# Patient Record
Sex: Male | Born: 1995
Health system: Southern US, Community
[De-identification: ages and names within clinical notes are randomized; demographics above are authoritative.]

## PROBLEM LIST (undated history)

## (undated) DIAGNOSIS — Z8614 Personal history of Methicillin resistant Staphylococcus aureus infection: Secondary | ICD-10-CM

## (undated) DIAGNOSIS — S83519A Sprain of anterior cruciate ligament of unspecified knee, initial encounter: Secondary | ICD-10-CM

## (undated) HISTORY — PX: ORCHIOPEXY: SHX479

---
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
## 2016-04-18 MED FILL — IBUPROFEN 800 MG TABLET: 800 | 7 days supply | Qty: 21 | Fill #0

## 2016-04-18 MED FILL — diazePAM 5 MG TABS: 5 | 1 days supply | Qty: 1 | Fill #0

## 2016-04-18 MED FILL — AMOXICILLIN 500 MG CAPSULE: 500 | 7 days supply | Qty: 21 | Fill #0

## 2016-04-18 MED FILL — METHYLPREDNISOLONE 4 MG TAB: 4 | 6 days supply | Qty: 21 | Fill #0

## 2016-04-18 MED FILL — OXYCODONE/APAP 5/325 MG TAB: 5-325 | 6 days supply | Qty: 24 | Fill #0

## 2016-05-03 MED FILL — AMOXICILLIN 500 MG CAPSULE: 500 | 7 days supply | Qty: 21 | Fill #0

## 2016-07-23 ENCOUNTER — Telehealth: Payer: Self-pay | Admitting: Internal Medicine

## 2016-07-23 NOTE — Telephone Encounter (Signed)
Patients mother Joshua Carpenter is a current patient of yours. 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 Please advise.

## 2016-07-30 NOTE — Telephone Encounter (Signed)
Yes I will see him

## 2016-08-23 ENCOUNTER — Encounter: Payer: Self-pay | Admitting: Internal Medicine

## 2016-08-23 ENCOUNTER — Ambulatory Visit (INDEPENDENT_AMBULATORY_CARE_PROVIDER_SITE_OTHER): Payer: PRIVATE HEALTH INSURANCE | Admitting: Internal Medicine

## 2016-08-23 ENCOUNTER — Other Ambulatory Visit (INDEPENDENT_AMBULATORY_CARE_PROVIDER_SITE_OTHER): Payer: PRIVATE HEALTH INSURANCE

## 2016-08-23 VITALS — BP 112/80 | HR 72 | Temp 98.3°F | Resp 16 | Ht 67.4 in | Wt 208.0 lb

## 2016-08-23 DIAGNOSIS — Z23 Encounter for immunization: Secondary | ICD-10-CM | POA: Diagnosis not present

## 2016-08-23 DIAGNOSIS — Z0000 Encounter for general adult medical examination without abnormal findings: Secondary | ICD-10-CM

## 2016-08-23 DIAGNOSIS — J4530 Mild persistent asthma, uncomplicated: Secondary | ICD-10-CM | POA: Insufficient documentation

## 2016-08-23 LAB — COMPREHENSIVE METABOLIC PANEL
ALBUMIN: 4.5 g/dL (ref 3.5–5.2)
ALK PHOS: 73 U/L (ref 39–117)
ALT: 15 U/L (ref 0–53)
AST: 18 U/L (ref 0–37)
BUN: 9 mg/dL (ref 6–23)
CO2: 31 mEq/L (ref 19–32)
CREATININE: 1.07 mg/dL (ref 0.40–1.50)
Calcium: 9.9 mg/dL (ref 8.4–10.5)
Chloride: 107 mEq/L (ref 96–112)
GFR: 112.29 mL/min (ref >60.00)
Glucose, Bld: 84 mg/dL (ref 70–99)
Potassium: 5 mEq/L (ref 3.5–5.1)
SODIUM: 143 meq/L (ref 135–145)
TOTAL PROTEIN: 7.4 g/dL (ref 6.0–8.3)
Total Bilirubin: 0.5 mg/dL (ref 0.2–1.2)

## 2016-08-23 LAB — TSH: TSH: 1.3 u[IU]/mL (ref 0.35–5.50)

## 2016-08-23 LAB — CBC WITH DIFFERENTIAL/PLATELET
BASOS ABS: 0 10*3/uL (ref 0.0–0.1)
Basophils Relative: 0.7 % (ref 0.0–3.0)
EOS ABS: 0.4 10*3/uL (ref 0.0–0.7)
Eosinophils Relative: 7.9 % — ABNORMAL HIGH (ref 0.0–5.0)
HCT: 47.7 % (ref 39.0–52.0)
HEMOGLOBIN: 16.1 g/dL (ref 13.0–17.0)
Lymphocytes Relative: 45 % (ref 12.0–46.0)
Lymphs Abs: 2.1 10*3/uL (ref 0.7–4.0)
MCHC: 33.8 g/dL (ref 30.0–36.0)
MCV: 89.1 fl (ref 78.0–100.0)
MONO ABS: 0.5 10*3/uL (ref 0.1–1.0)
Monocytes Relative: 9.9 % (ref 3.0–12.0)
Neutro Abs: 1.7 10*3/uL (ref 1.4–7.7)
Neutrophils Relative %: 36.5 % — ABNORMAL LOW (ref 43.0–77.0)
Platelets: 222 10*3/uL (ref 150.0–400.0)
RBC: 5.35 Mil/uL (ref 4.22–5.81)
RDW: 13.6 % (ref 11.5–14.6)
WBC: 4.6 10*3/uL (ref 4.5–10.5)

## 2016-08-23 LAB — LIPID PANEL
CHOLESTEROL: 180 mg/dL (ref 0–200)
HDL: 37.4 mg/dL — ABNORMAL LOW (ref >39.00)
LDL Cholesterol: 130 mg/dL — ABNORMAL HIGH (ref 0–99)
NonHDL: 142.69
TRIGLYCERIDES: 62 mg/dL (ref 0.0–149.0)
Total CHOL/HDL Ratio: 5
VLDL: 12.4 mg/dL (ref 0.0–40.0)

## 2016-08-23 NOTE — Patient Instructions (Signed)

## 2016-08-23 NOTE — Progress Notes (Signed)
Subjective:  Patient ID: Joshua Carpenter, male    DOB: 1996/08/15  Age: 20 y.o. MRN: [id number]  CC: Annual Exam and Asthma  NEW TO ME  HPI Joshua Carpenter presents for a CPX. He feels well and offers no complaints. He has a remote history of asthma but has had no recent exacerbations and has not felt the need to use his albuterol inhaler lately.  Outpatient Medications Prior to Visit  Medication Sig Dispense Refill  . albuterol (PROVENTIL HFA;VENTOLIN HFA) 108 (90 BASE) MCG/ACT inhaler Inhale 2 puffs into the lungs every 6 (six) hours as needed. For shortness of breath     . Multiple Vitamin (MULTIVITAMIN) tablet Take 1 tablet by mouth daily.    Marland Kitchen. albuterol (PROVENTIL HFA;VENTOLIN HFA) 108 (90 BASE) MCG/ACT inhaler Inhale 2 puffs into the lungs every 4 (four) hours as needed for wheezing. 1 Inhaler 0  . HYDROcodone-acetaminophen (NORCO) 10-325 MG per tablet Take 1-2 tablets by mouth every 6 (six) hours as needed for pain. 50 tablet 0   No facility-administered medications prior to visit.     ROS Review of Systems  Constitutional: Negative.  Negative for chills and fatigue.  HENT: Negative.   Eyes: Negative.  Negative for visual disturbance.  Respiratory: Negative for cough, choking, shortness of breath and stridor.   Cardiovascular: Negative for chest pain, palpitations and leg swelling.  Gastrointestinal: Negative.  Negative for abdominal pain.  Endocrine: Negative.   Genitourinary: Negative.  Negative for discharge, dysuria, frequency, penile pain, penile swelling, scrotal swelling and urgency.  Musculoskeletal: Negative.   Allergic/Immunologic: Negative.   Neurological: Negative.  Negative for dizziness.  Hematological: Negative.  Negative for adenopathy. Does not bruise/bleed easily.  Psychiatric/Behavioral: Negative.     Objective:  BP 112/80 (BP Location: Left Arm, Patient Position: Sitting, Cuff Size: Large)   Pulse 72   Temp 98.3 F (36.8 C) (Oral)   Resp 16    Ht 5' 7.4" (1.712 m)   Wt 208 lb (94.3 kg)   SpO2 98%   BMI 32.19 kg/m   BP Readings from Last 3 Encounters:  08/23/16 112/80  10/23/12 117/72  10/14/11 117/57    Wt Readings from Last 3 Encounters:  08/23/16 208 lb (94.3 kg)  10/23/12 178 lb 12.8 oz (81.1 kg) (89 %, Z= 1.22)*  10/14/11 161 lb 13.1 oz (73.4 kg) (84 %, Z= 0.98)*   * Growth percentiles are based on CDC 2-20 Years data.    Physical Exam  Constitutional: He is oriented to person, place, and time. No distress.  HENT:  Mouth/Throat: Oropharynx is clear and moist. No oropharyngeal exudate.  Eyes: Conjunctivae are normal. Right eye exhibits no discharge. Left eye exhibits no discharge. No scleral icterus.  Neck: Normal range of motion. Neck supple. No JVD present. No tracheal deviation present. No thyromegaly present.  Cardiovascular: Normal rate, regular rhythm, normal heart sounds and intact distal pulses.  Exam reveals no gallop and no friction rub.   No murmur heard. Pulmonary/Chest: Effort normal and breath sounds normal. No stridor. No respiratory distress. He has no wheezes. He has no rales. He exhibits no tenderness.  Abdominal: Soft. Bowel sounds are normal. He exhibits no distension and no mass. There is no tenderness. There is no rebound and no guarding. Hernia confirmed negative in the right inguinal area and confirmed negative in the left inguinal area.  Genitourinary: Testes normal and penis normal. Right testis shows no mass, no swelling and no tenderness. Right testis is descended. Left  testis shows no mass, no swelling and no tenderness. Left testis is descended. Circumcised. No penile erythema or penile tenderness. No discharge found.  Musculoskeletal: Normal range of motion. He exhibits no edema, tenderness or deformity.  Lymphadenopathy:    He has no cervical adenopathy.       Right: No inguinal adenopathy present.       Left: No inguinal adenopathy present.  Neurological: He is oriented to person,  place, and time.  Skin: Skin is warm and dry. No rash noted. He is not diaphoretic. No erythema. No pallor.  Vitals reviewed.   Lab Results  Component Value Date   HGB 15.4 10/23/2012    No results found.  Assessment & Plan:   Joshua Carpenter was seen today for annual exam and asthma.  Diagnoses and all orders for this visit:  Mild persistent asthma without complication  Routine general medical examination at a health care facility- exam completed, labs ordered and reviewed, vaccines reviewed and updated, patient education material was given. -     Lipid panel; Future -     Comprehensive metabolic panel; Future -     CBC with Differential/Platelet; Future -     TSH; Future   I have discontinued Mr. Pickering HYDROcodone-acetaminophen. I am also having him maintain his albuterol and multivitamin.  No orders of the defined types were placed in this encounter.    Follow-up: No Follow-up on file.  Sanda Linger, MD

## 2016-08-23 NOTE — Progress Notes (Signed)
Pre visit review using our clinic review tool, if applicable. No additional management support is needed unless otherwise documented below in the visit note. 

## 2017-06-14 MED FILL — CEPHALEXIN 500 MG CAPSULE: 500 | 10 days supply | Qty: 40 | Fill #0

## 2017-08-14 DIAGNOSIS — M25561 Pain in right knee: Secondary | ICD-10-CM | POA: Diagnosis not present

## 2018-05-07 ENCOUNTER — Encounter: Payer: Self-pay | Admitting: Internal Medicine

## 2018-05-07 ENCOUNTER — Other Ambulatory Visit (INDEPENDENT_AMBULATORY_CARE_PROVIDER_SITE_OTHER): Payer: 59

## 2018-05-07 ENCOUNTER — Ambulatory Visit (INDEPENDENT_AMBULATORY_CARE_PROVIDER_SITE_OTHER): Payer: 59 | Admitting: Internal Medicine

## 2018-05-07 VITALS — BP 108/64 | HR 58 | Temp 98.2°F | Resp 16 | Ht 67.4 in | Wt 197.0 lb

## 2018-05-07 DIAGNOSIS — Z0000 Encounter for general adult medical examination without abnormal findings: Secondary | ICD-10-CM | POA: Diagnosis not present

## 2018-05-07 DIAGNOSIS — Z7251 High risk heterosexual behavior: Secondary | ICD-10-CM | POA: Diagnosis not present

## 2018-05-07 DIAGNOSIS — J4530 Mild persistent asthma, uncomplicated: Secondary | ICD-10-CM

## 2018-05-07 LAB — URINALYSIS, ROUTINE W REFLEX MICROSCOPIC
Hgb urine dipstick: NEGATIVE
KETONES UR: NEGATIVE
LEUKOCYTES UA: NEGATIVE
Nitrite: NEGATIVE
RBC / HPF: NONE SEEN (ref 0–?)
TOTAL PROTEIN, URINE-UPE24: NEGATIVE
URINE GLUCOSE: NEGATIVE
Urobilinogen, UA: 0.2 (ref 0.0–1.0)
pH: 6 (ref 5.0–8.0)

## 2018-05-07 MED ORDER — ALBUTEROL SULFATE HFA 108 (90 BASE) MCG/ACT IN AERS: 2.0000 | INHALATION_SPRAY | Freq: Four times a day (QID) | RESPIRATORY_TRACT | 1 refills | Status: DC | PRN

## 2018-05-07 NOTE — Progress Notes (Signed)
Subjective:  Patient ID: Joshua CivilDarius J Carpenter, male    DOB: 08-10-1996  Age: 22 y.o. MRN: [id number]  CC: Annual Exam   HPI Joshua Carpenter presents for a CPX.  He wants to be screened for sexually transmitted infections.  He has no suspicious symptoms and has never had an STI before.  He has had 3 male partners in the past and engages in unprotected intercourse.  Outpatient Medications Prior to Visit  Medication Sig Dispense Refill  . Multiple Vitamin (MULTIVITAMIN) tablet Take 1 tablet by mouth daily.    Marland Kitchen. albuterol (PROVENTIL HFA;VENTOLIN HFA) 108 (90 BASE) MCG/ACT inhaler Inhale 2 puffs into the lungs every 6 (six) hours as needed. For shortness of breath      No facility-administered medications prior to visit.     ROS Review of Systems  Constitutional: Negative.  Negative for chills, fatigue and fever.  HENT: Negative.  Negative for sore throat and trouble swallowing.   Eyes: Negative for pain and visual disturbance.  Respiratory: Negative.  Negative for apnea.   Cardiovascular: Negative for chest pain.  Gastrointestinal: Negative for abdominal pain, constipation, diarrhea, nausea and vomiting.  Endocrine: Negative.   Genitourinary: Negative for difficulty urinating, discharge, dysuria, genital sores, penile swelling, scrotal swelling, testicular pain and urgency.  Musculoskeletal: Negative.   Skin: Negative.  Negative for rash.  Allergic/Immunologic: Negative.   Neurological: Negative.  Negative for dizziness.  Hematological: Negative for adenopathy. Does not bruise/bleed easily.  Psychiatric/Behavioral: Negative.     Objective:  BP 108/64 (BP Location: Left Arm, Patient Position: Sitting, Cuff Size: Normal)   Pulse (!) 58   Temp 98.2 F (36.8 C) (Oral)   Resp 16   Ht 5' 7.4" (1.712 m)   Wt 197 lb (89.4 kg)   SpO2 96%   BMI 30.49 kg/m   BP Readings from Last 3 Encounters:  05/07/18 108/64  08/23/16 112/80  10/23/12 117/72 (48 %, Z = -0.05 /  62 %, Z = 0.31)*    *BP percentiles are based on the August 2017 AAP Clinical Practice Guideline for boys    Wt Readings from Last 3 Encounters:  05/07/18 197 lb (89.4 kg)  08/23/16 208 lb (94.3 kg)  10/23/12 178 lb 12.8 oz (81.1 kg) (89 %, Z= 1.22)*   * Growth percentiles are based on CDC (Boys, 2-20 Years) data.    Physical Exam  Constitutional: He is oriented to person, place, and time. No distress.  HENT:  Mouth/Throat: Oropharynx is clear and moist. No oropharyngeal exudate.  Eyes: Conjunctivae are normal. No scleral icterus.  Neck: Normal range of motion. Neck supple. No JVD present. No thyromegaly present.  Cardiovascular: Normal rate and normal heart sounds.  No murmur heard. Pulmonary/Chest: Effort normal and breath sounds normal. He has no wheezes.  Abdominal: Soft. Bowel sounds are normal. He exhibits no mass. There is no hepatosplenomegaly. There is no tenderness. No hernia. Hernia confirmed negative in the right inguinal area and confirmed negative in the left inguinal area.  Genitourinary: Testes normal and penis normal. Right testis shows no mass, no swelling and no tenderness. Left testis shows no mass, no swelling and no tenderness. Circumcised. No penile erythema or penile tenderness. No discharge found.  Musculoskeletal: Normal range of motion. He exhibits no edema, tenderness or deformity.  Lymphadenopathy:    He has no cervical adenopathy. No inguinal adenopathy noted on the right or left side.  Neurological: He is alert and oriented to person, place, and time.  Skin: Skin  is warm and dry. No rash noted. He is not diaphoretic.  Vitals reviewed.   Lab Results  Component Value Date   WBC 4.6 08/23/2016   HGB 16.1 08/23/2016   HCT 47.7 08/23/2016   PLT 222.0 08/23/2016   GLUCOSE 84 08/23/2016   CHOL 180 08/23/2016   TRIG 62.0 08/23/2016   HDL 37.40 (L) 08/23/2016   LDLCALC 130 (H) 08/23/2016   ALT 15 08/23/2016   AST 18 08/23/2016   NA 143 08/23/2016   K 5.0 08/23/2016     CL 107 08/23/2016   CREATININE 1.07 08/23/2016   BUN 9 08/23/2016   CO2 31 08/23/2016   TSH 1.30 08/23/2016    No results found.  Assessment & Plan:   Joshua Carpenter was seen today for annual exam.  Diagnoses and all orders for this visit:  Routine general medical examination at a health care facility- Exam completed, labs reviewed, vaccines reviewed, patient education material was given. -     HIV antibody; Future -     RPR; Future -     Urinalysis, Routine w reflex microscopic; Future -     Cancel: GC/Chlamydia Probe Amp; Future -     GC/Chlamydia Probe Amp; Future  Mild persistent asthma without complication  High risk heterosexual behavior- Screening for STIs is negative.  He was advised to practice safe sexual activities. -     HIV antibody; Future -     RPR; Future -     Urinalysis, Routine w reflex microscopic; Future -     Cancel: GC/Chlamydia Probe Amp; Future -     GC/Chlamydia Probe Amp; Future  Other orders -     albuterol (PROVENTIL HFA;VENTOLIN HFA) 108 (90 Base) MCG/ACT inhaler; Inhale 2 puffs into the lungs every 6 (six) hours as needed. For shortness of breath   I am having Joshua Carpenter maintain his multivitamin and albuterol.  Meds ordered this encounter  Medications  . albuterol (PROVENTIL HFA;VENTOLIN HFA) 108 (90 Base) MCG/ACT inhaler    Sig: Inhale 2 puffs into the lungs every 6 (six) hours as needed. For shortness of breath    Dispense:  3 Inhaler    Refill:  1     Follow-up: Return if symptoms worsen or fail to improve.  Sanda Linger, MD

## 2018-05-07 NOTE — Patient Instructions (Signed)

## 2018-05-08 LAB — GC/CHLAMYDIA PROBE AMP
CHLAMYDIA, DNA PROBE: NEGATIVE
Neisseria gonorrhoeae by PCR: NEGATIVE

## 2018-05-09 ENCOUNTER — Encounter: Payer: Self-pay | Admitting: Internal Medicine

## 2018-05-09 LAB — HIV ANTIBODY (ROUTINE TESTING W REFLEX): HIV: NONREACTIVE

## 2018-05-09 LAB — RPR: RPR Ser Ql: NONREACTIVE

## 2018-07-14 DIAGNOSIS — S83512A Sprain of anterior cruciate ligament of left knee, initial encounter: Secondary | ICD-10-CM | POA: Diagnosis not present

## 2018-07-16 DIAGNOSIS — M25562 Pain in left knee: Secondary | ICD-10-CM | POA: Diagnosis not present

## 2018-07-21 DIAGNOSIS — M25462 Effusion, left knee: Secondary | ICD-10-CM | POA: Diagnosis not present

## 2018-07-30 ENCOUNTER — Other Ambulatory Visit: Payer: Self-pay | Admitting: Orthopedic Surgery

## 2018-08-05 DIAGNOSIS — S83519A Sprain of anterior cruciate ligament of unspecified knee, initial encounter: Secondary | ICD-10-CM

## 2018-08-05 HISTORY — DX: Sprain of anterior cruciate ligament of unspecified knee, initial encounter: S83.519A

## 2018-08-06 NOTE — Pre-Procedure Instructions (Signed)
ASAD KEEVEN  08/06/2018      [id number] [place], [place] - [address] [place] [address] Phone: [phone] Fax: [phone]    Your procedure is scheduled on Monday, Oct. 7th   Report to [facility] Admitting at  12:30pm             (posted surgery time 2:30p - 4:30p)   Call this number if you have problems the morning of surgery:  [phone]   Remember:   Do not eat any foods or drink any liquids after midnight, Sunday.              5 days prior to surgery, STOP TAKING any Vitamins, Herbal Supplements, Anti-inflammatories.    Take these medicines the morning of surgery with A SIP OF WATER : Zyrtec              May use your inhaler that morning.    Do not wear jewelry - no rings, watches, piercings.  Do not wear lotions, powders, perfumes, or deodorant.             Men may shave face and neck.  Do not bring valuables to the hospital.  [facility] is not responsible for any belongings or valuables.  Contacts, dentures or bridgework may not be worn into surgery.  Leave your suitcase in the car.  After surgery it may be brought to your room.  For patients admitted to the hospital, discharge time will be determined by your treatment team.   Please read over the following fact sheets that you were given. Pain Booklet and Surgical Site Infection Prevention       [place]- Preparing For Surgery  Before surgery, you can play an important role. Because skin is not sterile, your skin needs to be as free of germs as possible. You can reduce the number of germs on your skin by washing with CHG (chlorahexidine gluconate) Soap before surgery.  CHG is an antiseptic cleaner which kills germs and bonds with the skin to continue killing germs even after washing.    Oral Hygiene is also important to reduce your risk of infection.    Remember - BRUSH YOUR TEETH THE MORNING OF SURGERY WITH YOUR  REGULAR TOOTHPASTE  Please do not use if you have an allergy to CHG or antibacterial soaps. If your skin becomes reddened/irritated stop using the CHG.  Do not shave (including legs and underarms) for at least 48 hours prior to first CHG shower. It is OK to shave your face.  Please follow these instructions carefully.   1. Shower the NIGHT BEFORE SURGERY and the MORNING OF SURGERY with CHG.   2. If you chose to wash your hair, wash your hair first as usual with your normal shampoo.  3. After you shampoo, rinse your hair and body thoroughly to remove the shampoo.  4. Use CHG as you would any other liquid soap. You can apply CHG directly to the skin and wash gently with a scrungie or a clean washcloth.   5. Apply the CHG Soap to your body ONLY FROM THE NECK DOWN.  Do not use on open wounds or open sores. Avoid contact with your eyes, ears, mouth and genitals (private parts). Wash Face and genitals (private parts)  with your normal soap.  6. Wash thoroughly, paying special attention to the area where your surgery will be performed.  7.  Thoroughly rinse your body with warm water from the neck down.  8. DO NOT shower/wash with your normal soap after using and rinsing off the CHG Soap.  9. Pat yourself dry with a CLEAN TOWEL.  10. Wear CLEAN PAJAMAS to bed the night before surgery, wear comfortable clothes the morning of surgery  11. Place CLEAN SHEETS on your bed the night of your first shower and DO NOT SLEEP WITH PETS.  Day of Surgery:  Do not apply any deodorants/lotions.  Please wear clean clothes to the hospital/surgery center.   Remember to brush your teeth WITH YOUR REGULAR TOOTHPASTE.

## 2018-08-07 ENCOUNTER — Encounter (HOSPITAL_COMMUNITY): Payer: Self-pay

## 2018-08-07 ENCOUNTER — Encounter (HOSPITAL_COMMUNITY)
Admission: RE | Admit: 2018-08-07 | Discharge: 2018-08-07 | Disposition: A | Discharge status: Home / Self Care | Payer: 59 | Source: Ambulatory Visit | Attending: Orthopedic Surgery | Admitting: Orthopedic Surgery

## 2018-08-07 ENCOUNTER — Other Ambulatory Visit: Payer: Self-pay

## 2018-08-07 DIAGNOSIS — Z01812 Encounter for preprocedural laboratory examination: Secondary | ICD-10-CM | POA: Diagnosis not present

## 2018-08-07 LAB — CBC
HEMATOCRIT: 50.6 % (ref 39.0–52.0)
Hemoglobin: 16.1 g/dL (ref 13.0–17.0)
MCH: 29.2 pg (ref 26.0–34.0)
MCHC: 31.8 g/dL (ref 30.0–36.0)
MCV: 91.8 fL (ref 78.0–100.0)
Platelets: 231 10*3/uL (ref 150–400)
RBC: 5.51 MIL/uL (ref 4.22–5.81)
RDW: 11.9 % (ref 11.5–15.5)
WBC: 4 10*3/uL (ref 4.0–10.5)

## 2018-08-07 LAB — SURGICAL PCR SCREEN
MRSA, PCR: NEGATIVE
STAPHYLOCOCCUS AUREUS: NEGATIVE

## 2018-08-07 NOTE — Progress Notes (Signed)
PCP is Dr. Minda Ditto by Clearence Ped.  LOV 05/2018 Denies murmur, cp, sob, cardiac testing.

## 2018-08-08 ENCOUNTER — Other Ambulatory Visit: Payer: Self-pay

## 2018-08-08 ENCOUNTER — Encounter (HOSPITAL_BASED_OUTPATIENT_CLINIC_OR_DEPARTMENT_OTHER): Payer: Self-pay | Admitting: *Deleted

## 2018-08-11 ENCOUNTER — Encounter (HOSPITAL_BASED_OUTPATIENT_CLINIC_OR_DEPARTMENT_OTHER): Payer: Self-pay

## 2018-08-11 ENCOUNTER — Other Ambulatory Visit: Payer: Self-pay

## 2018-08-11 ENCOUNTER — Encounter (HOSPITAL_BASED_OUTPATIENT_CLINIC_OR_DEPARTMENT_OTHER)
Admission: RE | Disposition: A | Discharge status: Home / Self Care | Payer: Self-pay | Source: Ambulatory Visit | Attending: Orthopedic Surgery

## 2018-08-11 ENCOUNTER — Ambulatory Visit (HOSPITAL_BASED_OUTPATIENT_CLINIC_OR_DEPARTMENT_OTHER)
Admission: RE | Admit: 2018-08-11 | Discharge: 2018-08-11 | Disposition: A | Discharge status: Home / Self Care | Payer: 59 | Source: Ambulatory Visit | Attending: Orthopedic Surgery | Admitting: Orthopedic Surgery

## 2018-08-11 ENCOUNTER — Ambulatory Visit (HOSPITAL_BASED_OUTPATIENT_CLINIC_OR_DEPARTMENT_OTHER): Payer: 59 | Admitting: Anesthesiology

## 2018-08-11 DIAGNOSIS — S83282A Other tear of lateral meniscus, current injury, left knee, initial encounter: Secondary | ICD-10-CM | POA: Diagnosis not present

## 2018-08-11 DIAGNOSIS — Z8614 Personal history of Methicillin resistant Staphylococcus aureus infection: Secondary | ICD-10-CM | POA: Diagnosis not present

## 2018-08-11 DIAGNOSIS — M25562 Pain in left knee: Secondary | ICD-10-CM | POA: Diagnosis present

## 2018-08-11 DIAGNOSIS — S83512A Sprain of anterior cruciate ligament of left knee, initial encounter: Secondary | ICD-10-CM | POA: Insufficient documentation

## 2018-08-11 DIAGNOSIS — M94262 Chondromalacia, left knee: Secondary | ICD-10-CM | POA: Insufficient documentation

## 2018-08-11 DIAGNOSIS — X58XXXA Exposure to other specified factors, initial encounter: Secondary | ICD-10-CM | POA: Insufficient documentation

## 2018-08-11 DIAGNOSIS — M2242 Chondromalacia patellae, left knee: Secondary | ICD-10-CM | POA: Diagnosis not present

## 2018-08-11 DIAGNOSIS — J45909 Unspecified asthma, uncomplicated: Secondary | ICD-10-CM | POA: Insufficient documentation

## 2018-08-11 DIAGNOSIS — G8918 Other acute postprocedural pain: Secondary | ICD-10-CM | POA: Diagnosis not present

## 2018-08-11 HISTORY — DX: Sprain of anterior cruciate ligament of unspecified knee, initial encounter: S83.519A

## 2018-08-11 HISTORY — PX: KNEE ARTHROSCOPY WITH ANTERIOR CRUCIATE LIGAMENT (ACL) REPAIR: SHX5644

## 2018-08-11 SURGERY — KNEE ARTHROSCOPY WITH ANTERIOR CRUCIATE LIGAMENT (ACL) REPAIR
Anesthesia: General | Site: Knee | Laterality: Left

## 2018-08-11 MED ORDER — KETOROLAC TROMETHAMINE 30 MG/ML IJ SOLN
INTRAMUSCULAR | Status: DC | PRN
  Administered 2018-08-11: 30 mg via INTRAVENOUS

## 2018-08-11 MED ORDER — CHLORHEXIDINE GLUCONATE 4 % EX LIQD: 60.0000 mL | Freq: Once | CUTANEOUS | Status: DC

## 2018-08-11 MED ORDER — FENTANYL CITRATE (PF) 100 MCG/2ML IJ SOLN
INTRAMUSCULAR | Status: AC
  Filled 2018-08-11: qty 2

## 2018-08-11 MED ORDER — CEFAZOLIN SODIUM-DEXTROSE 2-4 GM/100ML-% IV SOLN
2.0000 g | INTRAVENOUS | Status: AC
  Administered 2018-08-11: 2 g via INTRAVENOUS

## 2018-08-11 MED ORDER — FENTANYL CITRATE (PF) 100 MCG/2ML IJ SOLN
50.0000 ug | INTRAMUSCULAR | Status: AC | PRN
  Administered 2018-08-11 (×3): 25 ug via INTRAVENOUS
  Administered 2018-08-11 (×2): 50 ug via INTRAVENOUS
  Administered 2018-08-11: 25 ug via INTRAVENOUS

## 2018-08-11 MED ORDER — DEXAMETHASONE SODIUM PHOSPHATE 10 MG/ML IJ SOLN
INTRAMUSCULAR | Status: AC
  Filled 2018-08-11: qty 1

## 2018-08-11 MED ORDER — FENTANYL CITRATE (PF) 100 MCG/2ML IJ SOLN
25.0000 ug | INTRAMUSCULAR | Status: DC | PRN
  Administered 2018-08-11: 50 ug via INTRAVENOUS

## 2018-08-11 MED ORDER — PROPOFOL 10 MG/ML IV BOLUS
INTRAVENOUS | Status: DC | PRN
  Administered 2018-08-11: 300 mg via INTRAVENOUS

## 2018-08-11 MED ORDER — ONDANSETRON HCL 4 MG/2ML IJ SOLN
INTRAMUSCULAR | Status: AC
  Filled 2018-08-11: qty 2

## 2018-08-11 MED ORDER — ROPIVACAINE HCL 5 MG/ML IJ SOLN
INTRAMUSCULAR | Status: DC | PRN
  Administered 2018-08-11: 20 mL via PERINEURAL

## 2018-08-11 MED ORDER — DIPHENHYDRAMINE HCL 50 MG/ML IJ SOLN: 12.5000 mg | Freq: Four times a day (QID) | INTRAMUSCULAR | Status: DC | PRN

## 2018-08-11 MED ORDER — OXYCODONE-ACETAMINOPHEN 5-325 MG PO TABS: 1.0000 | ORAL_TABLET | Freq: Four times a day (QID) | ORAL | 0 refills | Status: DC | PRN

## 2018-08-11 MED ORDER — SCOPOLAMINE 1 MG/3DAYS TD PT72: 1.0000 | MEDICATED_PATCH | Freq: Once | TRANSDERMAL | Status: DC | PRN

## 2018-08-11 MED ORDER — OXYCODONE HCL 5 MG/5ML PO SOLN: 5.0000 mg | Freq: Once | ORAL | Status: DC | PRN

## 2018-08-11 MED ORDER — LACTATED RINGERS IV SOLN
INTRAVENOUS | Status: DC
  Administered 2018-08-11 (×2): via INTRAVENOUS

## 2018-08-11 MED ORDER — MIDAZOLAM HCL 2 MG/2ML IJ SOLN
INTRAMUSCULAR | Status: AC
  Filled 2018-08-11: qty 2

## 2018-08-11 MED ORDER — ONDANSETRON HCL 4 MG/2ML IJ SOLN: 4.0000 mg | Freq: Once | INTRAMUSCULAR | Status: DC | PRN

## 2018-08-11 MED ORDER — LIDOCAINE 2% (20 MG/ML) 5 ML SYRINGE
INTRAMUSCULAR | Status: AC
  Filled 2018-08-11: qty 5

## 2018-08-11 MED ORDER — DEXAMETHASONE SODIUM PHOSPHATE 4 MG/ML IJ SOLN
INTRAMUSCULAR | Status: DC | PRN
  Administered 2018-08-11: 10 mg via INTRAVENOUS

## 2018-08-11 MED ORDER — DIPHENHYDRAMINE HCL 50 MG/ML IJ SOLN
12.5000 mg | Freq: Four times a day (QID) | INTRAMUSCULAR | Status: DC | PRN
  Administered 2018-08-11: 12.5 mg via INTRAVENOUS

## 2018-08-11 MED ORDER — ONDANSETRON HCL 4 MG/2ML IJ SOLN
INTRAMUSCULAR | Status: DC | PRN
  Administered 2018-08-11: 4 mg via INTRAVENOUS

## 2018-08-11 MED ORDER — LIDOCAINE HCL (CARDIAC) PF 100 MG/5ML IV SOSY
PREFILLED_SYRINGE | INTRAVENOUS | Status: DC | PRN
  Administered 2018-08-11: 100 mg via INTRAVENOUS

## 2018-08-11 MED ORDER — CEFAZOLIN SODIUM-DEXTROSE 2-4 GM/100ML-% IV SOLN
INTRAVENOUS | Status: AC
  Filled 2018-08-11: qty 100

## 2018-08-11 MED ORDER — MIDAZOLAM HCL 2 MG/2ML IJ SOLN
1.0000 mg | INTRAMUSCULAR | Status: DC | PRN
  Administered 2018-08-11: 2 mg via INTRAVENOUS

## 2018-08-11 MED ORDER — KETOROLAC TROMETHAMINE 30 MG/ML IJ SOLN
INTRAMUSCULAR | Status: AC
  Filled 2018-08-11: qty 1

## 2018-08-11 MED ORDER — DIPHENHYDRAMINE HCL 50 MG/ML IJ SOLN
INTRAMUSCULAR | Status: AC
  Filled 2018-08-11: qty 1

## 2018-08-11 MED ORDER — OXYCODONE HCL 5 MG PO TABS: 5.0000 mg | ORAL_TABLET | Freq: Once | ORAL | Status: DC | PRN

## 2018-08-11 SURGICAL SUPPLY — 73 items
APL SKNCLS STERI-STRIP NONHPOA (GAUZE/BANDAGES/DRESSINGS) ×1
BANDAGE ESMARK 6X9 LF (GAUZE/BANDAGES/DRESSINGS) IMPLANT
BENZOIN TINCTURE PRP APPL 2/3 (GAUZE/BANDAGES/DRESSINGS) ×3 IMPLANT
BLADE 4.2CUDA (BLADE) IMPLANT
BLADE AVERAGE 25MMX9MM (BLADE)
BLADE AVERAGE 25X9 (BLADE) ×1 IMPLANT
BLADE CUDA 5.5 (BLADE) IMPLANT
BLADE CUTTER GATOR 3.5 (BLADE) IMPLANT
BLADE GREAT WHITE 4.2 (BLADE) ×1 IMPLANT
BLADE GREAT WHITE 4.2MM (BLADE)
BLADE OSCIL/SAGITTAL W/10 ST (BLADE) ×1 IMPLANT
BLADE OSCIL/SAGITTAL W/10MM ST (BLADE) ×1
BLADE SURG 15 STRL LF DISP TIS (BLADE) ×1 IMPLANT
BLADE SURG 15 STRL SS (BLADE) ×3
BNDG CMPR 9X6 STRL LF SNTH (GAUZE/BANDAGES/DRESSINGS)
BNDG ESMARK 6X9 LF (GAUZE/BANDAGES/DRESSINGS)
BUR OVAL 4.0 (BURR) ×1 IMPLANT
CLOSURE WOUND 1/2 X4 (GAUZE/BANDAGES/DRESSINGS) ×1
COVER BACK TABLE 60X90IN (DRAPES) ×3 IMPLANT
DRAPE ARTHROSCOPY W/POUCH 90 (DRAPES) ×3 IMPLANT
DRSG EMULSION OIL 3X3 NADH (GAUZE/BANDAGES/DRESSINGS) ×3 IMPLANT
DURAPREP 26ML APPLICATOR (WOUND CARE) ×3 IMPLANT
ELECT MENISCUS 165MM 90D (ELECTRODE) IMPLANT
ELECT REM PT RETURN 9FT ADLT (ELECTROSURGICAL) ×3
ELECTRODE REM PT RTRN 9FT ADLT (ELECTROSURGICAL) IMPLANT
GAUZE SPONGE 4X4 12PLY STRL (GAUZE/BANDAGES/DRESSINGS) ×3 IMPLANT
GLOVE BIOGEL PI IND STRL 8 (GLOVE) ×2 IMPLANT
GLOVE BIOGEL PI INDICATOR 8 (GLOVE) ×4
GLOVE ECLIPSE 7.5 STRL STRAW (GLOVE) ×6 IMPLANT
GOWN STRL REUS W/ TWL LRG LVL3 (GOWN DISPOSABLE) ×1 IMPLANT
GOWN STRL REUS W/ TWL XL LVL3 (GOWN DISPOSABLE) ×1 IMPLANT
GOWN STRL REUS W/TWL LRG LVL3 (GOWN DISPOSABLE) ×3
GOWN STRL REUS W/TWL XL LVL3 (GOWN DISPOSABLE) ×9 IMPLANT
HOLDER KNEE FOAM BLUE (MISCELLANEOUS) ×3 IMPLANT
IMMOBILIZER KNEE 22 UNIV (SOFTGOODS) IMPLANT
IMMOBILIZER KNEE 24 THIGH 36 (MISCELLANEOUS) IMPLANT
IMMOBILIZER KNEE 24 UNIV (MISCELLANEOUS) ×3
IV NS IRRIG 3000ML ARTHROMATIC (IV SOLUTION) ×6 IMPLANT
KIT TRANSTIBIAL (DISPOSABLE) ×3 IMPLANT
KNEE WRAP E Z 3 GEL PACK (MISCELLANEOUS) ×3 IMPLANT
KNIFE GRAFT ACL 10MM 5952 (MISCELLANEOUS) IMPLANT
KNIFE GRAFT ACL 11MM (MISCELLANEOUS) IMPLANT
MANIFOLD NEPTUNE II (INSTRUMENTS) ×3 IMPLANT
NDL SAFETY ECLIPSE 18X1.5 (NEEDLE) ×2 IMPLANT
NEEDLE HYPO 18GX1.5 SHARP (NEEDLE) ×6
PACK ARTHROSCOPY DSU (CUSTOM PROCEDURE TRAY) ×3 IMPLANT
PACK BASIN DAY SURGERY FS (CUSTOM PROCEDURE TRAY) ×3 IMPLANT
PAD CAST 4YDX4 CTTN HI CHSV (CAST SUPPLIES) ×2 IMPLANT
PADDING CAST COTTON 4X4 STRL (CAST SUPPLIES) ×6
PASSER SUT SWANSON 36MM LOOP (INSTRUMENTS) IMPLANT
PENCIL BUTTON HOLSTER BLD 10FT (ELECTRODE) ×2 IMPLANT
SCREW INTERFERENCE 7X20MM (Screw) ×2 IMPLANT
SCREW SHEATHED INTERF 7X20 (Screw) ×2 IMPLANT
SHEET MEDIUM DRAPE 40X70 STRL (DRAPES) ×3 IMPLANT
SPONGE LAP 4X18 RFD (DISPOSABLE) ×3 IMPLANT
STRIP CLOSURE SKIN 1/2X4 (GAUZE/BANDAGES/DRESSINGS) ×2 IMPLANT
SUCTION FRAZIER HANDLE 10FR (MISCELLANEOUS) ×2
SUCTION TUBE FRAZIER 10FR DISP (MISCELLANEOUS) ×1 IMPLANT
SUT ETHILON 4 0 PS 2 18 (SUTURE) IMPLANT
SUT MNCRL AB 3-0 PS2 18 (SUTURE) ×3 IMPLANT
SUT PDS AB 1 CT  36 (SUTURE) ×4
SUT PDS AB 1 CT 36 (SUTURE) ×2 IMPLANT
SUT STEEL 5 (SUTURE) ×3 IMPLANT
SUT TICRON 1 T 12 (SUTURE) ×2 IMPLANT
SUT VIC AB 0 CT1 27 (SUTURE)
SUT VIC AB 0 CT1 27XBRD ANBCTR (SUTURE) IMPLANT
SUT VIC AB 2-0 SH 27 (SUTURE) ×3
SUT VIC AB 2-0 SH 27XBRD (SUTURE) IMPLANT
SYR 5ML LL (SYRINGE) ×3 IMPLANT
TOWEL GREEN STERILE FF (TOWEL DISPOSABLE) ×9 IMPLANT
TOWEL OR NON WOVEN STRL DISP B (DISPOSABLE) ×3 IMPLANT
TUBING ARTHRO INFLOW-ONLY STRL (TUBING) ×3 IMPLANT
WATER STERILE IRR 1000ML POUR (IV SOLUTION) ×3 IMPLANT

## 2018-08-11 NOTE — Anesthesia Postprocedure Evaluation (Signed)
Anesthesia Post Note  Patient: Joshua Carpenter  Procedure(s) Performed: LEFT KNEE ARTHROSCOPY WITH ANTERIOR CRUCIATE LIGAMENT (ACL) REPAIR (Left Knee)     Patient location during evaluation: PACU Anesthesia Type: General Level of consciousness: awake and alert Pain management: pain level controlled Vital Signs Assessment: post-procedure vital signs reviewed and stable Respiratory status: spontaneous breathing, nonlabored ventilation and respiratory function stable Cardiovascular status: blood pressure returned to baseline and stable Postop Assessment: no apparent nausea or vomiting Anesthetic complications: no    Last Vitals:  Vitals:   08/11/18 1135 08/11/18 1432  BP:  (!) 139/95  Pulse: (!) 50 88  Resp: 14   Temp:    SpO2: 100% 97%    Last Pain:  Vitals:   08/11/18 0956  TempSrc: Oral  PainSc:                  [name]

## 2018-08-11 NOTE — Transfer of Care (Signed)
Immediate Anesthesia Transfer of Care Note  Patient: Joshua Carpenter  Procedure(s) Performed: LEFT KNEE ARTHROSCOPY WITH ANTERIOR CRUCIATE LIGAMENT (ACL) REPAIR (Left Knee)  Patient Location: PACU  Anesthesia Type:GA combined with regional for post-op pain  Level of Consciousness: drowsy and patient cooperative  Airway & Oxygen Therapy: Patient Spontanous Breathing and Patient connected to face mask oxygen  Post-op Assessment: Report given to RN and Post -op Vital signs reviewed and stable  Post vital signs: Reviewed and stable  Last Vitals:  Vitals Value Taken Time  BP    Temp    Pulse 132 08/11/2018  2:32 PM  Resp 11 08/11/2018  2:32 PM  SpO2 91 % 08/11/2018  2:32 PM  Vitals shown include unvalidated device data.  Last Pain:  Vitals:   08/11/18 0956  TempSrc: Oral  PainSc:          Complications: No apparent anesthesia complications

## 2018-08-11 NOTE — Discharge Instructions (Signed)
POST-OP ACL RECONSTRUCTION 1. Take one 325mg  Aspirin daily with food x 2 weeks post op to decrease risk of blood clots.  2. Leave the steri-strips in place over your incisions when performing dressing changes and showering. Remove your dressings tomorrow to perform first dressings change. Then change dressing daily or as needed. You may shower and get incision wet on day 5 from surgery. Do not submerge incision in tub or under water.  3. Wear your knee immobilizer or hinged knee brace to sleep and at all times while up. You may put full weight on operative leg with brace on. Crutches are for comfort. Remove brace to perform attached exercises.  4. It is strongly recommended to ice and elevate your leg on a regular basis and at a minimum of 4 times a day for 30 minute sessions. You should ice after your exercise sessions and more if you are having a lot of pain or increase in swelling.  5. The thigh high Ted hose (white stockings) help to decrease swelling and prevent blood clots. It is recommended that you wear them on both legs for the first 3 days. Then you should wear on the operative extremity when upright but can remove to sleep.  6. If you had a block pre-operatively to provide post-op pain relief you may want to go ahead and begin utilizing your pain meds as your arm begins to wake up. Blocks can sometimes last up to 16-18 hours. If you are still pain-free prior to going to bed you may want to strongly consider taking a pain medication to avoid being awakened in the night with the onset of pain. A muscle relaxant is also provided for you should you experience muscle spasms. It is recommended that if you are experiencing pain that your pain medication alone is not controlling, add the muscle relaxant along with the pain medication which can give additional pain relief. The first one to two days is generally the most severe of your pain and then should gradually decrease. As your pain lessens it  is recommended that you decrease your use of the pain medications to an "as needed basis" only and to always comply with the recommended dosages of the pain medications.  7. Pain medications can produce constipation along with their use. If you experience this, the use of an over the counter stool softener or laxative daily is recommended.   8. For additional questions or concerns, please do not hesitate to call the office. If after hours there is an answering service to forward your concerns to the physician on call.   POST-OP EXERCISES  Repeat each exercise 10 times per set. Do 3 sets per session. Do 3 sessions per day.  Ankle/Foot Range of Motion  With leg relaxed, gently flex and extend ankle. Move through full range of motion.     Knee Extension Mobilization: Towel Prop  With a small towel rolled under your ankle, relax your leg to feel a comfortable stretch along the backside of your knee/leg. Hold for 3-5 minutes.     Hip/Knee Strengthening: Quadriceps Set  Tighten your quadriceps (top of thigh) by pulling your patella (kneecap) toward your hip. Keep your buttocks relaxed. Hold for 5-10 seconds.     Hip/Knee Strengthening: Straight Leg Raise  With your brace on, tighten your quadriceps and lift your leg 12-18 inches from surface.     Hip/Knee Stretching: Calf - Towel  Sit with knee straight and towel looped around your foot. Gently  pull on towel until stretch is felt in calf. Hold for 20-30 seconds.     Hip/Knee: Knee Flexion  With a towel around your heel, gently pull knee up with towel until stretch is felt. Hold 20-30 seconds.       Regional Anesthesia Blocks  1. Numbness or the inability to move the "blocked" extremity may last from 3-48 hours after placement. The length of time depends on the medication injected and your individual response to the medication. If the numbness is not going away after 48 hours, call your surgeon.  2. The extremity  that is blocked will need to be protected until the numbness is gone and the  Strength has returned. Because you cannot feel it, you will need to take extra care to avoid injury. Because it may be weak, you may have difficulty moving it or using it. You may not know what position it is in without looking at it while the block is in effect.  3. For blocks in the legs and feet, returning to weight bearing and walking needs to be done carefully. You will need to wait until the numbness is entirely gone and the strength has returned. You should be able to move your leg and foot normally before you try and bear weight or walk. You will need someone to be with you when you first try to ensure you do not fall and possibly risk injury.  4. Bruising and tenderness at the needle site are common side effects and will resolve in a few days.  5. Persistent numbness or new problems with movement should be communicated to the surgeon or the [facility] [phone] [facility] [phone]).    Post Anesthesia Home Care Instructions  Activity: Get plenty of rest for the remainder of the day. A responsible individual must stay with you for 24 hours following the procedure.  For the next 24 hours, DO NOT: -Drive a car -Advertising copywriter -Drink alcoholic beverages -Take any medication unless instructed by your physician -Make any legal decisions or sign important papers.  Meals: Start with liquid foods such as gelatin or soup. Progress to regular foods as tolerated. Avoid greasy, spicy, heavy foods. If nausea and/or vomiting occur, drink only clear liquids until the nausea and/or vomiting subsides. Call your physician if vomiting continues.  Special Instructions/Symptoms: Your throat may feel dry or sore from the anesthesia or the breathing tube placed in your throat during surgery. If this causes discomfort, gargle with warm salt water. The discomfort should disappear within 24  hours.  If you had a scopolamine patch placed behind your ear for the management of post- operative nausea and/or vomiting:  1. The medication in the patch is effective for 72 hours, after which it should be removed.  Wrap patch in a tissue and discard in the trash. Wash hands thoroughly with soap and water. 2. You may remove the patch earlier than 72 hours if you experience unpleasant side effects which may include dry mouth, dizziness or visual disturbances. 3. Avoid touching the patch. Wash your hands with soap and water after contact with the patch.   You received Toradol 30 mg IV at 2:00pm. You must wait until after 8:00pm to take any ibuprofen.  You received benadryl 12.5 mg  At 2:45pm, please wait 6 hours before taking benadryl again if needed.

## 2018-08-11 NOTE — Brief Op Note (Signed)
08/11/2018  4:56 PM  PATIENT:  Carole Civil  22 y.o. male  PRE-OPERATIVE DIAGNOSIS:  LEFT KNEE ANTERIOR CRUCIATE LIGAMENT TEAR  POST-OPERATIVE DIAGNOSIS:  LEFT KNEE ANTERIOR CRUCIATE LIGAMENT TEAR  PROCEDURE:  Procedure(s): LEFT KNEE ARTHROSCOPY WITH ANTERIOR CRUCIATE LIGAMENT (ACL) REPAIR (Left)  SURGEON:  Surgeon(s) and Role:    Jodi Geralds, MD - Primary  PHYSICIAN ASSISTANT:   ASSISTANTS: bethune   ANESTHESIA:   general  EBL:  10 mL   BLOOD ADMINISTERED:none  DRAINS: none   LOCAL MEDICATIONS USED:  MARCAINE     SPECIMEN:  No Specimen  DISPOSITION OF SPECIMEN:  N/A  COUNTS:  YES  TOURNIQUET:   Total Tourniquet Time Documented: Thigh (Left) - 60 minutes Total: Thigh (Left) - 60 minutes   DICTATION: .Other Dictation: Dictation Number [id number]  PLAN OF CARE: Discharge to home after PACU  PATIENT DISPOSITION:  PACU - hemodynamically stable.   Delay start of Pharmacological VTE agent (>24hrs) due to surgical blood loss or risk of bleeding: no

## 2018-08-11 NOTE — H&P (Signed)
A pre op hand p   Chief Complaint: Left knee pain and instability  HPI: Joshua Carpenter is a 22 y.o. male who presents for evaluation of knee pain and instability. It has been present for greater than 3 months and has been worsening. He has failed conservative measures. Pain is rated as moderate.  Past Medical History:  Diagnosis Date  . ACL tear 08/2018   left  . Asthma    prn inhaler  . History of MRSA infection 2012   left leg   Past Surgical History:  Procedure Laterality Date  . OPEN REDUCTION INTERNAL FIXATION (ORIF) METACARPAL  10/23/2012   Procedure: OPEN REDUCTION INTERNAL FIXATION (ORIF) METACARPAL;  Surgeon: Loreta Ave, MD;  Location: [facility];  Service: Orthopedics;  Laterality: Left;  Open Reduction Internal Fixation Third Metacarpal Left Hand  . ORCHIOPEXY  age 28 mos.   Social History   Socioeconomic History  . Marital status: Single    Spouse name: Not on file  . Number of children: Not on file  . Years of education: Not on file  . Highest education level: Not on file  Occupational History  . Not on file  Social Needs  . Financial resource strain: Not on file  . Food insecurity:    Worry: Not on file    Inability: Not on file  . Transportation needs:    Medical: Not on file    Non-medical: Not on file  Tobacco Use  . Smoking status: Never Smoker  . Smokeless tobacco: Never Used  Substance and Sexual Activity  . Alcohol use: No  . Drug use: No  . Sexual activity: Not on file  Lifestyle  . Physical activity:    Days per week: Not on file    Minutes per session: Not on file  . Stress: Not on file  Relationships  . Social connections:    Talks on phone: Not on file    Gets together: Not on file    Attends religious service: Not on file    Active member of club or organization: Not on file    Attends meetings of clubs or organizations: Not on file    Relationship status: Not on file  Other Topics Concern  . Not on file   Social History Narrative  . Not on file   Family History  Problem Relation Age of Onset  . Hypertension Mother   . Anesthesia problems Mother        itching  . Asthma Father   . Diabetes Maternal Grandfather   . Hypertension Maternal Grandfather   . Heart disease Maternal Grandfather    No Known Allergies Prior to Admission medications   Medication Sig Start Date End Date Taking? Authorizing Provider  ibuprofen (ADVIL,MOTRIN) 200 MG tablet Take 200 mg by mouth every 6 (six) hours as needed.   Yes [provider]  Multiple Vitamin (MULTIVITAMIN) tablet Take 1 tablet by mouth daily.   Yes [provider]  albuterol (PROVENTIL HFA;VENTOLIN HFA) 108 (90 Base) MCG/ACT inhaler Inhale into the lungs every 6 (six) hours as needed for wheezing or shortness of breath.    [provider]     Positive ROS: None  All other systems have been reviewed and were otherwise negative with the exception of those mentioned in the HPI and as above.  Physical Exam: Vitals:   08/11/18 1130 08/11/18 1135  BP:    Pulse: (!) 49 (!) 50  Resp: 13 14  Temp:    SpO2: 100% 100%    General: Alert, no acute distress Cardiovascular: No pedal edema Respiratory: No cyanosis, no use of accessory musculature GI: No organomegaly, abdomen is soft and non-tender Skin: No lesions in the area of chief complaint Neurologic: Sensation intact distally Psychiatric: Patient is competent for consent with normal mood and affect Lymphatic: No axillary or cervical lymphadenopathy  MUSCULOSKELETAL: Left knee: Positive Lachman, positive pivot shift no medial lateral instability.  Mild lateral joint line tenderness.  MRI: MRI shows anterior cruciate ligament tear and questionable lateral meniscal tear.  Assessment/Plan: LEFT KNEE ANTERIOR CRUCIATE LIGAMENT TEAR Plan for Procedure(s): LEFT KNEE ARTHROSCOPY WITH ANTERIOR CRUCIATE LIGAMENT (ACL) REPAIR  The risks benefits and alternatives were  discussed with the patient including but not limited to the risks of nonoperative treatment, versus surgical intervention including infection, bleeding, nerve injury, malunion, nonunion, hardware prominence, hardware failure, need for hardware removal, blood clots, cardiopulmonary complications, morbidity, mortality, among others, and they were willing to proceed.  Predicted outcome is good, although there will be at least a six to nine month expected recovery.  Harvie Junior, MD 08/11/2018 12:14 PM

## 2018-08-11 NOTE — Anesthesia Procedure Notes (Signed)
Anesthesia Regional Block: Femoral nerve block   Pre-Anesthetic Checklist: ,, timeout performed, Correct Patient, Correct Site, Correct Laterality, Correct Procedure, Correct Position, site marked, Risks and benefits discussed,  Surgical consent,  Pre-op evaluation,  At surgeon's request and post-op pain management  Laterality: Left  Prep: chloraprep       Needles:  Injection technique: Single-shot  Needle Type: Echogenic Stimulator Needle     Needle Length: 9cm  Needle Gauge: 21     Additional Needles:   Procedures:,,,, ultrasound used (permanent image in chart),,,,  Narrative:  Start time: 08/11/2018 11:15 AM End time: 08/11/2018 11:20 AM Injection made incrementally with aspirations every 5 mL.  Performed by: Personally  Anesthesiologist: [name], MD  Additional Notes: Monitors applied. Injection made in 5cc increments. No resistance to injection. Good needle visualization. Patient tolerated procedure well.

## 2018-08-11 NOTE — Op Note (Signed)
NAME: Joshua Carpenter, Joshua J. MEDICAL RECORD [id number] ACCOUNT [id number] DATE OF BIRTH:02-05-1996 FACILITY: [facility] LOCATION: [facility] PHYSICIAN:Daevion Navarette L. Hinojosa, MD  OPERATIVE REPORT  DATE OF PROCEDURE:  08/11/2018  PREOPERATIVE DIAGNOSIS:  Anterior cruciate ligament insufficiency, left leg.  POSTOPERATIVE DIAGNOSES: 1.  Anterior cruciate ligament insufficiency, left leg. 2.  Chondromalacia, lateral femoral condyle. 3.  Lateral meniscal tear.  PROCEDURES: 1.  Anterior cruciate ligament reconstruction with central one-third patellar tendon autograft. 2.  Chondroplasty lateral femoral condyle. 3.  Benign neglect of lateral meniscal tear.  SURGEON:  Jodi Geralds, MD  ASSISTANT:  Gus Puma, PA-C.   ANESTHESIA:  General.  BRIEF HISTORY:  The patient is a 22 year old male with a history of having torn his ACL.  He was evaluated and noted to have this instability.  MRI showed a concern for meniscal capsular separation laterally and chondromalacia laterally.  He was brought  to the operating room for evaluation and fixation as needed.  DESCRIPTION OF PROCEDURE:  The patient brought to the operating room after adequate anesthesia obtained with general anesthetic, the patient was brought to the operating table.  The left leg was prepped and draped in the usual sterile fashion.  Following  this, the leg was exsanguinated, blood pressure tourniquet.  We followed this with  routine arthroscopic examination of the knee was undertaken and he was noted to have an anterior cruciate ligament insufficiency, medial meniscus, normal medial femoral  condyle, normal.  Laterally, he had some grade II and grade III changes of the lateral femoral condyle which was debrided with a suction shaver back to smooth and stable rim of articular cartilage.  We went back posterolaterally.  He definitely had an  undersurface tear posterolaterally.  It was a very peripheral.  Unfortunately, he had the beginnings of a  superior tear further out and I felt like if I were to use any kind of stitching, that I would probably up in the cleavage plane of the superior  rent and I might make things worse back there.  Given that we were going to stabilize them and there was no ability to displace this lateral meniscal tear at all.  I felt that benign neglect was appropriate.  At this point, we turned towards the notch  and notchplasty was performed with a suction shaver, and motorized bur.  Following this, the leg was exsanguinated.  Blood pressure tourniquet inflated to 300 mmHg and a midline incision was made from the patella down to the patellar tubercle.   Subcutaneous tissue down to the level of the paratenon, which was opened 11 mm.  The central patellar tendon was excised with a 9 mm bone plug from the patella and a 10 mm bone plug from the tibia.  This was taken to the back table and fashioned by Gus Puma, PA-C, graftologist.  The assistant in the case is Gus Puma PA-C.  Once this was done, the attention was turned back to the knee where a window was opened inferomedially and a guide was used, then a guidewire drilled into the old footprint of  the ACL was overreamed with a 10.  Rasp was used on the back of the canal.  A 6.5 mm over the top guide was used and a Beath needle was advanced out the distal lateral femur.  This was overreamed with a 9 mm reamer and a notch was made.  The graft was  then advanced into the knee and once this was done, the knee was locked into  place with a 7 x 20 screw on the femoral side and a 7 x 20 on the tibial side, care being taken after the femoral screw was put in to cycle the knee 20 times and put the graft  in, in its position of isometry.  At this time, the wounds were irrigated and suctioned dry and final images were taken.  We looked again medially and laterally and at this point, the parapatellar tendon.  The patellar tendon defect was closed with #1  Ethibond interrupted x3  and then the peritenon was closed with 0 Vicryl running, the skin with 0 and 2-0 Vicryl and a 3-0 Monocryl subcuticular was placed.  Following this, the sterile compressive dressing was applied and the patient was taken to  recovery was noted to be in satisfactory condition.    Estimated blood loss for procedure was minimal.  AN/NUANCE  D:08/11/2018 T:08/11/2018 JOB:[id number]

## 2018-08-11 NOTE — Progress Notes (Signed)
Assisted Dr. Witman with left, ultrasound guided, femoral block. Side rails up, monitors on throughout procedure. See vital signs in flow sheet. Tolerated Procedure well. 

## 2018-08-11 NOTE — Anesthesia Preprocedure Evaluation (Addendum)
Anesthesia Evaluation  Patient identified by MRN, date of birth, ID band Patient awake    Reviewed: Allergy & Precautions, NPO status , Patient's Chart, lab work & pertinent test results  History of Anesthesia Complications Negative for: history of anesthetic complications  Airway Mallampati: II  TM Distance: >3 FB Neck ROM: Full    Dental no notable dental hx.    Pulmonary neg pulmonary ROS, asthma (mild, rare inhaler use) ,    Pulmonary exam normal        Cardiovascular negative cardio ROS Normal cardiovascular exam     Neuro/Psych negative neurological ROS  negative psych ROS   GI/Hepatic negative GI ROS, Neg liver ROS,   Endo/Other  negative endocrine ROS  Renal/GU negative Renal ROS  negative genitourinary   Musculoskeletal negative musculoskeletal ROS (+)   Abdominal   Peds  Hematology negative hematology ROS (+)   Anesthesia Other Findings   Reproductive/Obstetrics                            Anesthesia Physical Anesthesia Plan  ASA: II  Anesthesia Plan: General   Post-op Pain Management: GA combined w/ Regional for post-op pain   Induction: Intravenous  PONV Risk Score and Plan: 2 and Ondansetron, Dexamethasone, Treatment may vary due to age or medical condition and Midazolam  Airway Management Planned: LMA  Additional Equipment:   Intra-op Plan:   Post-operative Plan: Extubation in OR  Informed Consent: I have reviewed the patients History and Physical, chart, labs and discussed the procedure including the risks, benefits and alternatives for the proposed anesthesia with the patient or authorized representative who has indicated his/her understanding and acceptance.     Plan Discussed with:   Anesthesia Plan Comments:        Anesthesia Quick Evaluation

## 2018-08-12 DIAGNOSIS — M2500 Hemarthrosis, unspecified joint: Secondary | ICD-10-CM | POA: Diagnosis not present

## 2018-08-19 ENCOUNTER — Encounter (HOSPITAL_BASED_OUTPATIENT_CLINIC_OR_DEPARTMENT_OTHER): Payer: Self-pay | Admitting: Orthopedic Surgery

## 2018-08-21 DIAGNOSIS — M25462 Effusion, left knee: Secondary | ICD-10-CM | POA: Diagnosis not present

## 2018-10-09 DIAGNOSIS — Z9889 Other specified postprocedural states: Secondary | ICD-10-CM | POA: Diagnosis not present

## 2019-01-21 MED FILL — VENTOLIN HFA 90 MCG INHALER: 108 (90 BAS | 75 days supply | Qty: 54 | Fill #0

## 2019-07-16 ENCOUNTER — Ambulatory Visit (INDEPENDENT_AMBULATORY_CARE_PROVIDER_SITE_OTHER): Payer: No Typology Code available for payment source | Admitting: Internal Medicine

## 2019-07-16 ENCOUNTER — Other Ambulatory Visit: Payer: Self-pay

## 2019-07-16 ENCOUNTER — Other Ambulatory Visit (INDEPENDENT_AMBULATORY_CARE_PROVIDER_SITE_OTHER): Payer: No Typology Code available for payment source

## 2019-07-16 ENCOUNTER — Encounter: Payer: Self-pay | Admitting: Internal Medicine

## 2019-07-16 VITALS — BP 124/80 | HR 68 | Temp 98.6°F | Resp 16 | Ht 68.0 in | Wt 208.0 lb

## 2019-07-16 DIAGNOSIS — Z23 Encounter for immunization: Secondary | ICD-10-CM

## 2019-07-16 DIAGNOSIS — J4530 Mild persistent asthma, uncomplicated: Secondary | ICD-10-CM | POA: Diagnosis not present

## 2019-07-16 DIAGNOSIS — Z0000 Encounter for general adult medical examination without abnormal findings: Secondary | ICD-10-CM

## 2019-07-16 LAB — CBC WITH DIFFERENTIAL/PLATELET
Basophils Absolute: 0 10*3/uL (ref 0.0–0.1)
Basophils Relative: 0.7 % (ref 0.0–3.0)
Eosinophils Absolute: 0.2 10*3/uL (ref 0.0–0.7)
Eosinophils Relative: 5.6 % — ABNORMAL HIGH (ref 0.0–5.0)
HCT: 47.9 % (ref 39.0–52.0)
Hemoglobin: 16.1 g/dL (ref 13.0–17.0)
Lymphocytes Relative: 43 % (ref 12.0–46.0)
Lymphs Abs: 1.9 10*3/uL (ref 0.7–4.0)
MCHC: 33.6 g/dL (ref 30.0–36.0)
MCV: 88.9 fl (ref 78.0–100.0)
Monocytes Absolute: 0.4 10*3/uL (ref 0.1–1.0)
Monocytes Relative: 9.2 % (ref 3.0–12.0)
Neutro Abs: 1.8 10*3/uL (ref 1.4–7.7)
Neutrophils Relative %: 41.5 % — ABNORMAL LOW (ref 43.0–77.0)
Platelets: 218 10*3/uL (ref 150.0–400.0)
RBC: 5.38 Mil/uL (ref 4.22–5.81)
RDW: 13.3 % (ref 11.5–15.5)
WBC: 4.3 10*3/uL (ref 4.0–10.5)

## 2019-07-16 LAB — BASIC METABOLIC PANEL
BUN: 11 mg/dL (ref 6–23)
CO2: 30 mEq/L (ref 19–32)
Calcium: 9.6 mg/dL (ref 8.4–10.5)
Chloride: 102 mEq/L (ref 96–112)
Creatinine, Ser: 1.1 mg/dL (ref 0.40–1.50)
GFR: 99.67 mL/min (ref >60.00)
Glucose, Bld: 82 mg/dL (ref 70–99)
Potassium: 4 mEq/L (ref 3.5–5.1)
Sodium: 138 mEq/L (ref 135–145)

## 2019-07-16 LAB — LIPID PANEL
Cholesterol: 191 mg/dL (ref 0–200)
HDL: 34.8 mg/dL — ABNORMAL LOW (ref >39.00)
LDL Cholesterol: 131 mg/dL — ABNORMAL HIGH (ref 0–99)
NonHDL: 155.8
Total CHOL/HDL Ratio: 5
Triglycerides: 126 mg/dL (ref 0.0–149.0)
VLDL: 25.2 mg/dL (ref 0.0–40.0)

## 2019-07-16 MED ORDER — ALBUTEROL SULFATE HFA 108 (90 BASE) MCG/ACT IN AERS: 1.0000 | INHALATION_SPRAY | Freq: Four times a day (QID) | RESPIRATORY_TRACT | 3 refills | Status: DC | PRN

## 2019-07-16 NOTE — Patient Instructions (Signed)

## 2019-07-16 NOTE — Progress Notes (Signed)
Subjective:  Patient ID: Joshua Carpenter, male    DOB: 01/10/1996  Age: 23 y.o. MRN: [id number]  CC: Annual Exam   HPI [name] presents for a CPX.  He has felt well recently and offers no complaints.  He rarely notices wheezing and when he does wheeze he gets adequate symptom relief with albuterol.  He does not want to use a LABA/ICS inhaler.  Outpatient Medications Prior to Visit  Medication Sig Dispense Refill  . Multiple Vitamin (MULTIVITAMIN) tablet Take 1 tablet by mouth daily.    Marland Kitchen albuterol (PROVENTIL HFA;VENTOLIN HFA) 108 (90 Base) MCG/ACT inhaler Inhale into the lungs every 6 (six) hours as needed for wheezing or shortness of breath.    Marland Kitchen ibuprofen (ADVIL,MOTRIN) 200 MG tablet Take 200 mg by mouth every 6 (six) hours as needed.    Marland Kitchen oxyCODONE-acetaminophen (PERCOCET/ROXICET) 5-325 MG tablet Take 1-2 tablets by mouth every 6 (six) hours as needed for severe pain. 30 tablet 0   No facility-administered medications prior to visit.     ROS Review of Systems  Constitutional: Negative for diaphoresis, fatigue and unexpected weight change.  HENT: Negative.   Eyes: Negative.   Respiratory: Negative for cough, chest tightness, shortness of breath and wheezing.   Cardiovascular: Negative for chest pain, palpitations and leg swelling.  Gastrointestinal: Negative for abdominal pain, diarrhea, nausea and vomiting.  Endocrine: Negative.   Genitourinary: Negative.  Negative for difficulty urinating, penile swelling, scrotal swelling, testicular pain and urgency.  Musculoskeletal: Negative.   Skin: Negative.   Neurological: Negative.  Negative for dizziness, weakness and light-headedness.  Hematological: Negative for adenopathy. Does not bruise/bleed easily.  Psychiatric/Behavioral: Negative.     Objective:  BP 124/80 (BP Location: Left Arm, Patient Position: Sitting, Cuff Size: Large)   Pulse 68   Temp 98.6 F (37 C) (Oral)   Resp 16   Ht 5\' 8"  (1.727 m)   Wt 208 lb  (94.3 kg)   SpO2 97%   BMI 31.63 kg/m   BP Readings from Last 3 Encounters:  07/16/19 124/80  08/11/18 140/88  08/07/18 122/74    Wt Readings from Last 3 Encounters:  07/16/19 208 lb (94.3 kg)  08/11/18 199 lb 8.3 oz (90.5 kg)  08/07/18 196 lb 3.2 oz (89 kg)    Physical Exam Vitals signs reviewed.  Constitutional:      Appearance: Normal appearance.  HENT:     Nose: Nose normal.     Mouth/Throat:     Mouth: Mucous membranes are moist.     Pharynx: No oropharyngeal exudate.  Eyes:     General: No scleral icterus.    Conjunctiva/sclera: Conjunctivae normal.  Neck:     Musculoskeletal: Normal range of motion. No neck rigidity or muscular tenderness.  Cardiovascular:     Rate and Rhythm: Normal rate and regular rhythm.     Heart sounds: No murmur.  Pulmonary:     Effort: Pulmonary effort is normal.     Breath sounds: No stridor. No wheezing, rhonchi or rales.  Abdominal:     General: Abdomen is flat. Bowel sounds are normal. There is no distension.     Palpations: There is no hepatomegaly, splenomegaly or mass.     Tenderness: There is no abdominal tenderness. There is no guarding.  Musculoskeletal: Normal range of motion.     Right lower leg: No edema.     Left lower leg: No edema.  Lymphadenopathy:     Cervical: No cervical adenopathy.  Skin:  General: Skin is warm and dry.     Coloration: Skin is not pale.  Neurological:     General: No focal deficit present.     Mental Status: He is alert.  Psychiatric:        Mood and Affect: Mood normal.        Behavior: Behavior normal.     Lab Results  Component Value Date   WBC 4.0 08/07/2018   HGB 16.1 08/07/2018   HCT 50.6 08/07/2018   PLT 231 08/07/2018   GLUCOSE 84 08/23/2016   CHOL 180 08/23/2016   TRIG 62.0 08/23/2016   HDL 37.40 (L) 08/23/2016   LDLCALC 130 (H) 08/23/2016   ALT 15 08/23/2016   AST 18 08/23/2016   NA 143 08/23/2016   K 5.0 08/23/2016   CL 107 08/23/2016   CREATININE 1.07  08/23/2016   BUN 9 08/23/2016   CO2 31 08/23/2016   TSH 1.30 08/23/2016    No results found.  Assessment & Plan:   Yamato was seen today for annual exam.  Diagnoses and all orders for this visit:  Need for influenza vaccination -     Flu Vaccine QUAD 36+ mos IM  Routine general medical examination at a health care facility- Exam completed, labs reviewed, vaccines reviewed and updated, patient education was given. -     Lipid panel; Future  Mild persistent asthma without complication- He is getting adequate symptom relief with the occasional use of albuterol.  He does not want to use a LABA/ICS inhaler. -     CBC with Differential/Platelet; Future -     Basic metabolic panel; Future -     albuterol (VENTOLIN HFA) 108 (90 Base) MCG/ACT inhaler; Inhale 1 puff into the lungs every 6 (six) hours as needed for wheezing or shortness of breath.   I have discontinued Nikoli J. Ionescu's ibuprofen and oxyCODONE-acetaminophen. I have also changed his albuterol. Additionally, I am having him maintain his multivitamin.  Meds ordered this encounter  Medications  . albuterol (VENTOLIN HFA) 108 (90 Base) MCG/ACT inhaler    Sig: Inhale 1 puff into the lungs every 6 (six) hours as needed for wheezing or shortness of breath.    Dispense:  18 g    Refill:  3     Follow-up: Return if symptoms worsen or fail to improve.  Sanda Lingerhomas Jilian West, MD

## 2020-08-01 ENCOUNTER — Encounter: Payer: Self-pay | Admitting: Internal Medicine

## 2020-08-23 ENCOUNTER — Encounter: Payer: No Typology Code available for payment source | Admitting: Internal Medicine

## 2020-09-06 ENCOUNTER — Other Ambulatory Visit: Payer: Self-pay

## 2020-09-06 ENCOUNTER — Ambulatory Visit (INDEPENDENT_AMBULATORY_CARE_PROVIDER_SITE_OTHER): Payer: No Typology Code available for payment source | Admitting: Internal Medicine

## 2020-09-06 ENCOUNTER — Encounter: Payer: Self-pay | Admitting: Internal Medicine

## 2020-09-06 VITALS — BP 126/78 | HR 64 | Temp 98.0°F | Resp 16 | Ht 68.0 in | Wt 221.0 lb

## 2020-09-06 DIAGNOSIS — Z23 Encounter for immunization: Secondary | ICD-10-CM

## 2020-09-06 DIAGNOSIS — J4530 Mild persistent asthma, uncomplicated: Secondary | ICD-10-CM

## 2020-09-06 DIAGNOSIS — Z0000 Encounter for general adult medical examination without abnormal findings: Secondary | ICD-10-CM | POA: Diagnosis not present

## 2020-09-06 LAB — CBC WITH DIFFERENTIAL/PLATELET
Basophils Absolute: 0 10*3/uL (ref 0.0–0.1)
Basophils Relative: 0.8 % (ref 0.0–3.0)
Eosinophils Absolute: 0.3 10*3/uL (ref 0.0–0.7)
Eosinophils Relative: 6.1 % — ABNORMAL HIGH (ref 0.0–5.0)
HCT: 47.3 % (ref 39.0–52.0)
Hemoglobin: 15.9 g/dL (ref 13.0–17.0)
Lymphocytes Relative: 44.4 % (ref 12.0–46.0)
Lymphs Abs: 1.9 10*3/uL (ref 0.7–4.0)
MCHC: 33.7 g/dL (ref 30.0–36.0)
MCV: 89.9 fl (ref 78.0–100.0)
Monocytes Absolute: 0.4 10*3/uL (ref 0.1–1.0)
Monocytes Relative: 9.3 % (ref 3.0–12.0)
Neutro Abs: 1.7 10*3/uL (ref 1.4–7.7)
Neutrophils Relative %: 39.4 % — ABNORMAL LOW (ref 43.0–77.0)
Platelets: 209 10*3/uL (ref 150.0–400.0)
RBC: 5.26 Mil/uL (ref 4.22–5.81)
RDW: 13.5 % (ref 11.5–15.5)
WBC: 4.3 10*3/uL (ref 4.0–10.5)

## 2020-09-06 LAB — LIPID PANEL
Cholesterol: 155 mg/dL (ref 0–200)
HDL: 36.3 mg/dL — ABNORMAL LOW (ref >39.00)
LDL Cholesterol: 101 mg/dL — ABNORMAL HIGH (ref 0–99)
NonHDL: 118.34
Total CHOL/HDL Ratio: 4
Triglycerides: 87 mg/dL (ref 0.0–149.0)
VLDL: 17.4 mg/dL (ref 0.0–40.0)

## 2020-09-06 NOTE — Patient Instructions (Signed)

## 2020-09-06 NOTE — Progress Notes (Signed)
Subjective:  Patient ID: Joshua Carpenter, male    DOB: 02-21-1996  Age: 24 y.o. MRN: [id number]  CC: Annual Exam  This visit occurred during the SARS-CoV-2 public health emergency.  Safety protocols were in place, including screening questions prior to the visit, additional usage of staff PPE, and extensive cleaning of exam room while observing appropriate contact time as indicated for disinfecting solutions.    HPI Joshua Carpenter presents for a CPX.  He tells me he is in his usual state of health. He has rare exercise-induced wheezing that is adequately treated with albuterol. He is active and denies any recent episodes of chest pain, palpitations, dizziness, lightheadedness, or near syncope.  Outpatient Medications Prior to Visit  Medication Sig Dispense Refill  . Multiple Vitamin (MULTIVITAMIN) tablet Take 1 tablet by mouth daily.    Marland Kitchen albuterol (VENTOLIN HFA) 108 (90 Base) MCG/ACT inhaler Inhale 1 puff into the lungs every 6 (six) hours as needed for wheezing or shortness of breath. 18 g 3   No facility-administered medications prior to visit.    ROS Review of Systems  Constitutional: Negative.  Negative for appetite change, chills, diaphoresis, fatigue and fever.  HENT: Negative.  Negative for sore throat and trouble swallowing.   Eyes: Negative.   Respiratory: Positive for wheezing. Negative for cough, choking and shortness of breath.   Cardiovascular: Negative for chest pain, palpitations and leg swelling.  Gastrointestinal: Negative for abdominal pain.  Endocrine: Negative.   Genitourinary: Negative.  Negative for difficulty urinating, penile pain and testicular pain.  Musculoskeletal: Negative.   Skin: Negative.  Negative for color change, pallor and rash.  Neurological: Negative.  Negative for dizziness, weakness and headaches.  Hematological: Negative for adenopathy. Does not bruise/bleed easily.  Psychiatric/Behavioral: Negative.     Objective:  BP 126/78    Pulse 64   Temp 98 F (36.7 C) (Oral)   Resp 16   Ht 5\' 8"  (1.727 m)   Wt 221 lb (100.2 kg)   SpO2 98%   BMI 33.60 kg/m   BP Readings from Last 3 Encounters:  09/06/20 126/78  07/16/19 124/80  08/11/18 140/88    Wt Readings from Last 3 Encounters:  09/06/20 221 lb (100.2 kg)  07/16/19 208 lb (94.3 kg)  08/11/18 199 lb 8.3 oz (90.5 kg)    Physical Exam Vitals reviewed.  Constitutional:      Appearance: Normal appearance.  HENT:     Nose: Nose normal.     Mouth/Throat:     Mouth: Mucous membranes are moist.  Eyes:     General: No scleral icterus.    Conjunctiva/sclera: Conjunctivae normal.  Cardiovascular:     Rate and Rhythm: Normal rate and regular rhythm.     Heart sounds: No murmur heard.   Pulmonary:     Effort: Pulmonary effort is normal.     Breath sounds: No stridor. No wheezing, rhonchi or rales.  Abdominal:     General: Abdomen is flat.     Palpations: There is no mass.     Tenderness: There is no abdominal tenderness. There is no guarding.  Musculoskeletal:        General: Normal range of motion.     Cervical back: Neck supple.  Lymphadenopathy:     Cervical: No cervical adenopathy.  Skin:    General: Skin is warm and dry.     Coloration: Skin is not pale.  Neurological:     General: No focal deficit present.  Mental Status: He is alert.  Psychiatric:        Mood and Affect: Mood normal.        Behavior: Behavior normal.     Lab Results  Component Value Date   WBC 4.3 09/06/2020   HGB 15.9 09/06/2020   HCT 47.3 09/06/2020   PLT 209.0 09/06/2020   GLUCOSE 82 07/16/2019   CHOL 155 09/06/2020   TRIG 87.0 09/06/2020   HDL 36.30 (L) 09/06/2020   LDLCALC 101 (H) 09/06/2020   ALT 15 08/23/2016   AST 18 08/23/2016   NA 138 07/16/2019   K 4.0 07/16/2019   CL 102 07/16/2019   CREATININE 1.10 07/16/2019   BUN 11 07/16/2019   CO2 30 07/16/2019   TSH 1.30 08/23/2016    No results found.  Assessment & Plan:   Marland was seen today  for annual exam.  Diagnoses and all orders for this visit:  Routine general medical examination at a health care facility- Exam completed, labs reviewed, vaccines reviewed and updated, patient education was given. -     Lipid panel; Future -     Lipid panel  Mild persistent asthma without complication- He is doing well on as needed use of a SABA. Will continue. -     CBC with Differential/Platelet; Future -     CBC with Differential/Platelet -     albuterol (VENTOLIN HFA) 108 (90 Base) MCG/ACT inhaler; Inhale 1 puff into the lungs every 6 (six) hours as needed for wheezing or shortness of breath.  Other orders -     Flu Vaccine QUAD 6+ mos PF IM (Fluarix Quad PF)   I am having Joshua Carpenter maintain his multivitamin and albuterol.  Meds ordered this encounter  Medications  . albuterol (VENTOLIN HFA) 108 (90 Base) MCG/ACT inhaler    Sig: Inhale 1 puff into the lungs every 6 (six) hours as needed for wheezing or shortness of breath.    Dispense:  18 g    Refill:  3     Follow-up: Return in about 1 year (around 09/06/2021).  Sanda Linger, MD

## 2020-09-08 MED ORDER — ALBUTEROL SULFATE HFA 108 (90 BASE) MCG/ACT IN AERS: 1.0000 | INHALATION_SPRAY | Freq: Four times a day (QID) | RESPIRATORY_TRACT | 3 refills | Status: AC | PRN

## 2021-07-25 ENCOUNTER — Ambulatory Visit: Payer: No Typology Code available for payment source | Admitting: Internal Medicine

## 2021-08-10 ENCOUNTER — Ambulatory Visit: Payer: No Typology Code available for payment source | Admitting: Internal Medicine

## 2021-09-21 ENCOUNTER — Other Ambulatory Visit: Payer: Self-pay

## 2021-09-21 ENCOUNTER — Ambulatory Visit (INDEPENDENT_AMBULATORY_CARE_PROVIDER_SITE_OTHER): Payer: No Typology Code available for payment source | Admitting: Internal Medicine

## 2021-09-21 ENCOUNTER — Encounter: Payer: Self-pay | Admitting: Internal Medicine

## 2021-09-21 VITALS — BP 110/82 | HR 64 | Temp 98.5°F | Resp 16 | Ht 68.0 in | Wt 229.0 lb

## 2021-09-21 DIAGNOSIS — Z23 Encounter for immunization: Secondary | ICD-10-CM | POA: Diagnosis not present

## 2021-09-21 DIAGNOSIS — J4530 Mild persistent asthma, uncomplicated: Secondary | ICD-10-CM

## 2021-09-21 DIAGNOSIS — Z0000 Encounter for general adult medical examination without abnormal findings: Secondary | ICD-10-CM | POA: Diagnosis not present

## 2021-09-21 DIAGNOSIS — R739 Hyperglycemia, unspecified: Secondary | ICD-10-CM | POA: Insufficient documentation

## 2021-09-21 LAB — BASIC METABOLIC PANEL
BUN: 9 mg/dL (ref 6–23)
CO2: 29 mEq/L (ref 19–32)
Calcium: 9.5 mg/dL (ref 8.4–10.5)
Chloride: 104 mEq/L (ref 96–112)
Creatinine, Ser: 1.17 mg/dL (ref 0.40–1.50)
GFR: 86.35 mL/min (ref >60.00)
Glucose, Bld: 92 mg/dL (ref 70–99)
Potassium: 4.4 mEq/L (ref 3.5–5.1)
Sodium: 139 mEq/L (ref 135–145)

## 2021-09-21 LAB — CBC WITH DIFFERENTIAL/PLATELET
Basophils Absolute: 0 10*3/uL (ref 0.0–0.1)
Basophils Relative: 0.8 % (ref 0.0–3.0)
Eosinophils Absolute: 0.2 10*3/uL (ref 0.0–0.7)
Eosinophils Relative: 5 % (ref 0.0–5.0)
HCT: 48.1 % (ref 39.0–52.0)
Hemoglobin: 16 g/dL (ref 13.0–17.0)
Lymphocytes Relative: 37.4 % (ref 12.0–46.0)
Lymphs Abs: 1.8 10*3/uL (ref 0.7–4.0)
MCHC: 33.3 g/dL (ref 30.0–36.0)
MCV: 89.7 fl (ref 78.0–100.0)
Monocytes Absolute: 0.5 10*3/uL (ref 0.1–1.0)
Monocytes Relative: 10.3 % (ref 3.0–12.0)
Neutro Abs: 2.2 10*3/uL (ref 1.4–7.7)
Neutrophils Relative %: 46.5 % (ref 43.0–77.0)
Platelets: 218 10*3/uL (ref 150.0–400.0)
RBC: 5.37 Mil/uL (ref 4.22–5.81)
RDW: 13.7 % (ref 11.5–15.5)
WBC: 4.7 10*3/uL (ref 4.0–10.5)

## 2021-09-21 LAB — HEMOGLOBIN A1C: Hgb A1c MFr Bld: 5.5 % (ref 4.6–6.5)

## 2021-09-21 NOTE — Patient Instructions (Signed)

## 2021-09-21 NOTE — Progress Notes (Signed)
Subjective:  Patient ID: Joshua Carpenter, male    DOB: [birth year]  Age: 25 y.o. MRN: [id number]  CC: Annual Exam and Asthma  This visit occurred during the SARS-CoV-2 public health emergency.  Safety protocols were in place, including screening questions prior to the visit, additional usage of staff PPE, and extensive cleaning of exam room while observing appropriate contact time as indicated for disinfecting solutions.    HPI Joshua Carpenter presents for a CPX and f/up -  His EIA has been well controlled. He has had no wheezing for > one month. He wants to be screened for DM2.  Outpatient Medications Prior to Visit  Medication Sig Dispense Refill   albuterol (VENTOLIN HFA) 108 (90 Base) MCG/ACT inhaler Inhale 1 puff into the lungs every 6 (six) hours as needed for wheezing or shortness of breath. 18 g 3   Multiple Vitamin (MULTIVITAMIN) tablet Take 1 tablet by mouth daily.     No facility-administered medications prior to visit.    ROS Review of Systems  Constitutional: Negative.  Negative for fatigue.  HENT: Negative.    Eyes: Negative.   Respiratory:  Negative for cough, shortness of breath and wheezing.   Cardiovascular:  Negative for chest pain, palpitations and leg swelling.  Gastrointestinal:  Negative for abdominal pain, constipation and diarrhea.  Endocrine: Negative.   Genitourinary: Negative.  Negative for difficulty urinating, penile swelling, scrotal swelling and testicular pain.  Musculoskeletal: Negative.   Skin: Negative.  Negative for rash.  Neurological:  Negative for dizziness, weakness and light-headedness.  Hematological:  Negative for adenopathy. Does not bruise/bleed easily.  Psychiatric/Behavioral: Negative.     Objective:  BP 110/82 (BP Location: Left Arm, Patient Position: Sitting, Cuff Size: Large)   Pulse 64   Temp 98.5 F (36.9 C) (Oral)   Resp 16   Ht 5\' 8"  (1.727 m)   Wt 229 lb (103.9 kg)   SpO2 96%   BMI 34.82 kg/m   BP Readings from  Last 3 Encounters:  09/21/21 110/82  09/06/20 126/78  07/16/19 124/80    Wt Readings from Last 3 Encounters:  09/21/21 229 lb (103.9 kg)  09/06/20 221 lb (100.2 kg)  07/16/19 208 lb (94.3 kg)    Physical Exam Vitals reviewed.  Constitutional:      Appearance: Normal appearance.  HENT:     Nose: Nose normal.     Mouth/Throat:     Mouth: Mucous membranes are moist.  Eyes:     General: No scleral icterus.    Conjunctiva/sclera: Conjunctivae normal.  Cardiovascular:     Rate and Rhythm: Normal rate and regular rhythm.     Heart sounds: No murmur heard. Pulmonary:     Effort: Pulmonary effort is normal.     Breath sounds: No stridor. No wheezing, rhonchi or rales.  Abdominal:     General: Abdomen is flat.     Palpations: There is no mass.     Tenderness: There is no abdominal tenderness. There is no guarding.  Musculoskeletal:        General: Normal range of motion.     Cervical back: Neck supple.     Right lower leg: No edema.     Left lower leg: No edema.  Lymphadenopathy:     Cervical: No cervical adenopathy.  Skin:    General: Skin is warm and dry.  Neurological:     General: No focal deficit present.     Mental Status: He is alert.  Psychiatric:  Mood and Affect: Mood normal.        Behavior: Behavior normal.    Lab Results  Component Value Date   WBC 4.7 09/21/2021   HGB 16.0 09/21/2021   HCT 48.1 09/21/2021   PLT 218.0 09/21/2021   GLUCOSE 92 09/21/2021   CHOL 155 09/06/2020   TRIG 87.0 09/06/2020   HDL 36.30 (L) 09/06/2020   LDLCALC 101 (H) 09/06/2020   ALT 15 08/23/2016   AST 18 08/23/2016   NA 139 09/21/2021   K 4.4 09/21/2021   CL 104 09/21/2021   CREATININE 1.17 09/21/2021   BUN 9 09/21/2021   CO2 29 09/21/2021   TSH 1.30 08/23/2016   HGBA1C 5.5 09/21/2021    No results found.  Assessment & Plan:   Joshua Carpenter was seen today for annual exam and asthma.  Diagnoses and all orders for this visit:  Mild persistent asthma without  complication- Well controlled -     CBC with Differential/Platelet; Future -     CBC with Differential/Platelet  Routine general medical examination at a health care facility- Exam completed, labs reviewed, vaccines reviewed and updated, no cancer screenings indicated, pt ed material was given.  Chronic hyperglycemia- His A1C is normal. -     Basic metabolic panel; Future -     Hemoglobin A1c; Future -     Hemoglobin A1c -     Basic metabolic panel  Need for influenza vaccination -     Flu Vaccine QUAD 6+ mos PF IM (Fluarix Quad PF)  I am having Joshua Carpenter maintain his multivitamin and albuterol.  No orders of the defined types were placed in this encounter.    Follow-up: Return in about 1 year (around 09/21/2022).  Sanda Linger, MD

## 2022-09-19 ENCOUNTER — Encounter: Payer: Self-pay | Admitting: Internal Medicine

## 2022-10-24 ENCOUNTER — Encounter: Payer: Self-pay | Admitting: Internal Medicine

## 2022-11-27 ENCOUNTER — Encounter: Payer: Self-pay | Admitting: Internal Medicine

## 2022-11-27 ENCOUNTER — Ambulatory Visit: Payer: BC Managed Care – PPO | Admitting: Internal Medicine

## 2022-11-27 VITALS — BP 124/84 | HR 65 | Temp 98.2°F | Ht 68.0 in | Wt 203.0 lb

## 2022-11-27 DIAGNOSIS — J4530 Mild persistent asthma, uncomplicated: Secondary | ICD-10-CM

## 2022-11-27 DIAGNOSIS — R739 Hyperglycemia, unspecified: Secondary | ICD-10-CM | POA: Diagnosis not present

## 2022-11-27 DIAGNOSIS — Z0000 Encounter for general adult medical examination without abnormal findings: Secondary | ICD-10-CM | POA: Diagnosis not present

## 2022-11-27 DIAGNOSIS — Z23 Encounter for immunization: Secondary | ICD-10-CM

## 2022-11-27 LAB — BASIC METABOLIC PANEL
BUN: 10 mg/dL (ref 6–23)
CO2: 30 mEq/L (ref 19–32)
Calcium: 9.6 mg/dL (ref 8.4–10.5)
Chloride: 100 mEq/L (ref 96–112)
Creatinine, Ser: 1.08 mg/dL (ref 0.40–1.50)
GFR: 94.27 mL/min (ref >60.00)
Glucose, Bld: 84 mg/dL (ref 70–99)
Potassium: 4.3 mEq/L (ref 3.5–5.1)
Sodium: 138 mEq/L (ref 135–145)

## 2022-11-27 LAB — CBC WITH DIFFERENTIAL/PLATELET
Basophils Absolute: 0 10*3/uL (ref 0.0–0.1)
Basophils Relative: 0.9 % (ref 0.0–3.0)
Eosinophils Absolute: 0.5 10*3/uL (ref 0.0–0.7)
Eosinophils Relative: 10.9 % — ABNORMAL HIGH (ref 0.0–5.0)
HCT: 49.6 % (ref 39.0–52.0)
Hemoglobin: 16.7 g/dL (ref 13.0–17.0)
Lymphocytes Relative: 37.2 % (ref 12.0–46.0)
Lymphs Abs: 1.7 10*3/uL (ref 0.7–4.0)
MCHC: 33.7 g/dL (ref 30.0–36.0)
MCV: 89.3 fl (ref 78.0–100.0)
Monocytes Absolute: 0.5 10*3/uL (ref 0.1–1.0)
Monocytes Relative: 10.9 % (ref 3.0–12.0)
Neutro Abs: 1.9 10*3/uL (ref 1.4–7.7)
Neutrophils Relative %: 40.1 % — ABNORMAL LOW (ref 43.0–77.0)
Platelets: 233 10*3/uL (ref 150.0–400.0)
RBC: 5.55 Mil/uL (ref 4.22–5.81)
RDW: 13.5 % (ref 11.5–15.5)
WBC: 4.6 10*3/uL (ref 4.0–10.5)

## 2022-11-27 LAB — HEPATIC FUNCTION PANEL
ALT: 14 U/L (ref 0–53)
AST: 14 U/L (ref 0–37)
Albumin: 4.6 g/dL (ref 3.5–5.2)
Alkaline Phosphatase: 73 U/L (ref 39–117)
Bilirubin, Direct: 0.1 mg/dL (ref 0.0–0.3)
Total Bilirubin: 0.6 mg/dL (ref 0.2–1.2)
Total Protein: 7.8 g/dL (ref 6.0–8.3)

## 2022-11-27 LAB — HEMOGLOBIN A1C: Hgb A1c MFr Bld: 5.7 % (ref 4.6–6.5)

## 2022-11-27 NOTE — Patient Instructions (Signed)
Health Maintenance, Male Adopting a healthy lifestyle and getting preventive care are important in promoting health and wellness. Ask your health care provider about: The right schedule for you to have regular tests and exams. Things you can do on your own to prevent diseases and keep yourself healthy. What should I know about diet, weight, and exercise? Eat a healthy diet  Eat a diet that includes plenty of vegetables, fruits, low-fat dairy products, and lean protein. Do not eat a lot of foods that are high in solid fats, added sugars, or sodium. Maintain a healthy weight Body mass index (BMI) is a measurement that can be used to identify possible weight problems. It estimates body fat based on height and weight. Your health care provider can help determine your BMI and help you achieve or maintain a healthy weight. Get regular exercise Get regular exercise. This is one of the most important things you can do for your health. Most adults should: Exercise for at least 150 minutes each week. The exercise should increase your heart rate and make you sweat (moderate-intensity exercise). Do strengthening exercises at least twice a week. This is in addition to the moderate-intensity exercise. Spend less time sitting. Even light physical activity can be beneficial. Watch cholesterol and blood lipids Have your blood tested for lipids and cholesterol at 28 years of age, then have this test every 5 years. You may need to have your cholesterol levels checked more often if: Your lipid or cholesterol levels are high. You are older than 28 years of age. You are at high risk for heart disease. What should I know about cancer screening? Many types of cancers can be detected early and may often be prevented. Depending on your health history and family history, you may need to have cancer screening at various ages. This may include screening for: Colorectal cancer. Prostate cancer. Skin cancer. Lung  cancer. What should I know about heart disease, diabetes, and high blood pressure? Blood pressure and heart disease High blood pressure causes heart disease and increases the risk of stroke. This is more likely to develop in people who have high blood pressure readings or are overweight. Talk with your health care provider about your target blood pressure readings. Have your blood pressure checked: Every 3-5 years if you are 18-39 years of age. Every year if you are 40 years old or older. If you are between the ages of 65 and 75 and are a current or former smoker, ask your health care provider if you should have a one-time screening for abdominal aortic aneurysm (AAA). Diabetes Have regular diabetes screenings. This checks your fasting blood sugar level. Have the screening done: Once every three years after age 45 if you are at a normal weight and have a low risk for diabetes. More often and at a younger age if you are overweight or have a high risk for diabetes. What should I know about preventing infection? Hepatitis B If you have a higher risk for hepatitis B, you should be screened for this virus. Talk with your health care provider to find out if you are at risk for hepatitis B infection. Hepatitis C Blood testing is recommended for: Everyone born from 1945 through 1965. Anyone with known risk factors for hepatitis C. Sexually transmitted infections (STIs) You should be screened each year for STIs, including gonorrhea and chlamydia, if: You are sexually active and are younger than 28 years of age. You are older than 28 years of age and your   health care provider tells you that you are at risk for this type of infection. Your sexual activity has changed since you were last screened, and you are at increased risk for chlamydia or gonorrhea. Ask your health care provider if you are at risk. Ask your health care provider about whether you are at high risk for HIV. Your health care provider  may recommend a prescription medicine to help prevent HIV infection. If you choose to take medicine to prevent HIV, you should first get tested for HIV. You should then be tested every 3 months for as long as you are taking the medicine. Follow these instructions at home: Alcohol use Do not drink alcohol if your health care provider tells you not to drink. If you drink alcohol: Limit how much you have to 0-2 drinks a day. Know how much alcohol is in your drink. In the [place] one drink equals one 12 oz bottle of beer (355 mL), one 5 oz glass of wine (148 mL), or one 1 oz glass of hard liquor (44 mL). Lifestyle Do not use any products that contain nicotine or tobacco. These products include cigarettes, chewing tobacco, and vaping devices, such as e-cigarettes. If you need help quitting, ask your health care provider. Do not use street drugs. Do not share needles. Ask your health care provider for help if you need support or information about quitting drugs. General instructions Schedule regular health, dental, and eye exams. Stay current with your vaccines. Tell your health care provider if: You often feel depressed. You have ever been abused or do not feel safe at home. Summary Adopting a healthy lifestyle and getting preventive care are important in promoting health and wellness. Follow your health care provider's instructions about healthy diet, exercising, and getting tested or screened for diseases. Follow your health care provider's instructions on monitoring your cholesterol and blood pressure. This information is not intended to replace advice given to you by your health care provider. Make sure you discuss any questions you have with your health care provider. Document Revised: 03/13/2021 Document Reviewed: 03/13/2021 Elsevier Patient Education  2023 [employer]  

## 2022-11-27 NOTE — Progress Notes (Unsigned)
Subjective:  Patient ID: Joshua Carpenter, male    DOB: Sep 17, 1996  Age: 27 y.o. MRN: [id number]  CC: Annual Exam   HPI Joshua Carpenter presents for a CPX -  He is monogamous with one male partner.  Outpatient Medications Prior to Visit  Medication Sig Dispense Refill   albuterol (VENTOLIN HFA) 108 (90 Base) MCG/ACT inhaler Inhale 1 puff into the lungs every 6 (six) hours as needed for wheezing or shortness of breath. 18 g 3   Multiple Vitamin (MULTIVITAMIN) tablet Take 1 tablet by mouth daily.     No facility-administered medications prior to visit.    ROS Review of Systems  Constitutional: Negative.   HENT: Negative.    Eyes: Negative.   Respiratory:  Negative for cough and shortness of breath.   Cardiovascular:  Negative for chest pain.  Gastrointestinal:  Negative for abdominal pain.  Endocrine: Negative.   Genitourinary: Negative.  Negative for scrotal swelling and testicular pain.  Musculoskeletal: Negative.   Skin: Negative.  Negative for rash.  Neurological:  Negative for dizziness and weakness.  Hematological:  Negative for adenopathy. Does not bruise/bleed easily.  Psychiatric/Behavioral: Negative.      Objective:  BP 124/84 (BP Location: Right Arm, Patient Position: Sitting, Cuff Size: Large)   Pulse 65   Temp 98.2 F (36.8 C) (Oral)   Ht 5\' 8"  (1.727 m)   Wt 203 lb (92.1 kg)   SpO2 99%   BMI 30.87 kg/m   BP Readings from Last 3 Encounters:  11/27/22 124/84  09/21/21 110/82  09/06/20 126/78    Wt Readings from Last 3 Encounters:  11/27/22 203 lb (92.1 kg)  09/21/21 229 lb (103.9 kg)  09/06/20 221 lb (100.2 kg)    Physical Exam Vitals reviewed.  HENT:     Nose: Nose normal.     Mouth/Throat:     Mouth: Mucous membranes are moist.  Eyes:     General: No scleral icterus.    Conjunctiva/sclera: Conjunctivae normal.  Cardiovascular:     Rate and Rhythm: Normal rate and regular rhythm.     Heart sounds: No murmur heard. Pulmonary:      Effort: Pulmonary effort is normal.     Breath sounds: No stridor. No wheezing, rhonchi or rales.  Abdominal:     General: Abdomen is flat.     Palpations: There is no mass.     Tenderness: There is no abdominal tenderness. There is no guarding.     Hernia: No hernia is present.  Musculoskeletal:        General: Normal range of motion.     Cervical back: Neck supple.     Right lower leg: No edema.     Left lower leg: No edema.  Lymphadenopathy:     Cervical: No cervical adenopathy.  Skin:    General: Skin is warm and dry.  Neurological:     General: No focal deficit present.     Mental Status: He is alert.  Psychiatric:        Mood and Affect: Mood normal.        Behavior: Behavior normal.     Lab Results  Component Value Date   WBC 4.7 09/21/2021   HGB 16.0 09/21/2021   HCT 48.1 09/21/2021   PLT 218.0 09/21/2021   GLUCOSE 92 09/21/2021   CHOL 155 09/06/2020   TRIG 87.0 09/06/2020   HDL 36.30 (L) 09/06/2020   LDLCALC 101 (H) 09/06/2020   ALT 15 08/23/2016  AST 18 08/23/2016   NA 139 09/21/2021   K 4.4 09/21/2021   CL 104 09/21/2021   CREATININE 1.17 09/21/2021   BUN 9 09/21/2021   CO2 29 09/21/2021   TSH 1.30 08/23/2016   HGBA1C 5.5 09/21/2021    No results found.  Assessment & Plan:   Joshua Carpenter was seen today for annual exam.  Diagnoses and all orders for this visit:  Mild persistent asthma without complication -     CBC with Differential/Platelet; Future  Chronic hyperglycemia -     Hepatic function panel; Future -     Hemoglobin A1c; Future -     Basic metabolic panel; Future  Routine general medical examination at a health care facility  Other orders -     Flu Vaccine QUAD 6+ mos PF IM (Fluarix Quad PF)   I am having Joshua Carpenter maintain his multivitamin and albuterol.  No orders of the defined types were placed in this encounter.    Follow-up: Return in about 1 year (around 11/28/2023).  Scarlette Calico, MD

## 2023-04-23 ENCOUNTER — Other Ambulatory Visit (HOSPITAL_COMMUNITY): Payer: Self-pay

## 2023-04-23 ENCOUNTER — Telehealth: Payer: BC Managed Care – PPO | Admitting: Family Medicine

## 2023-04-23 DIAGNOSIS — M5441 Lumbago with sciatica, right side: Secondary | ICD-10-CM

## 2023-04-23 MED ORDER — CYCLOBENZAPRINE HCL 10 MG PO TABS
10.0000 mg | ORAL_TABLET | Freq: Three times a day (TID) | ORAL | 0 refills | Status: AC | PRN
  Filled 2023-04-23: qty 30, 10d supply, fill #0

## 2023-04-23 MED ORDER — NAPROXEN 500 MG PO TABS
500.0000 mg | ORAL_TABLET | Freq: Two times a day (BID) | ORAL | 0 refills | Status: AC
  Filled 2023-04-23: qty 30, 15d supply, fill #0

## 2023-04-23 NOTE — Progress Notes (Signed)

## 2023-04-30 ENCOUNTER — Other Ambulatory Visit (HOSPITAL_COMMUNITY): Payer: Self-pay
